# Patient Record
Sex: Female | Born: 1975 | Race: White | Hispanic: No | Marital: Married | Smoking: Never smoker
Health system: Southern US, Community
[De-identification: ages and names within clinical notes are randomized; demographics above are authoritative.]

## PROBLEM LIST (undated history)

## (undated) DIAGNOSIS — K439 Ventral hernia without obstruction or gangrene: Secondary | ICD-10-CM

## (undated) DIAGNOSIS — R0602 Shortness of breath: Secondary | ICD-10-CM

## (undated) DIAGNOSIS — M5442 Lumbago with sciatica, left side: Secondary | ICD-10-CM

## (undated) DIAGNOSIS — F3340 Major depressive disorder, recurrent, in remission, unspecified: Secondary | ICD-10-CM

## (undated) DIAGNOSIS — M533 Sacrococcygeal disorders, not elsewhere classified: Secondary | ICD-10-CM

## (undated) DIAGNOSIS — M47816 Spondylosis without myelopathy or radiculopathy, lumbar region: Secondary | ICD-10-CM

## (undated) DIAGNOSIS — R002 Palpitations: Secondary | ICD-10-CM

## (undated) DIAGNOSIS — R932 Abnormal findings on diagnostic imaging of liver and biliary tract: Secondary | ICD-10-CM

## (undated) DIAGNOSIS — I951 Orthostatic hypotension: Secondary | ICD-10-CM

## (undated) DIAGNOSIS — Z8701 Personal history of pneumonia (recurrent): Secondary | ICD-10-CM

## (undated) DIAGNOSIS — R000 Tachycardia, unspecified: Secondary | ICD-10-CM

## (undated) DIAGNOSIS — M170 Bilateral primary osteoarthritis of knee: Secondary | ICD-10-CM

## (undated) DIAGNOSIS — F329 Major depressive disorder, single episode, unspecified: Secondary | ICD-10-CM

## (undated) DIAGNOSIS — R112 Nausea with vomiting, unspecified: Secondary | ICD-10-CM

## (undated) DIAGNOSIS — G43909 Migraine, unspecified, not intractable, without status migrainosus: Secondary | ICD-10-CM

## (undated) DIAGNOSIS — Z7901 Long term (current) use of anticoagulants: Secondary | ICD-10-CM

## (undated) DIAGNOSIS — R413 Other amnesia: Secondary | ICD-10-CM

## (undated) DIAGNOSIS — IMO0002 Reserved for concepts with insufficient information to code with codable children: Secondary | ICD-10-CM

## (undated) DIAGNOSIS — L723 Sebaceous cyst: Secondary | ICD-10-CM

## (undated) DIAGNOSIS — R0781 Pleurodynia: Secondary | ICD-10-CM

## (undated) DIAGNOSIS — Z86718 Personal history of other venous thrombosis and embolism: Secondary | ICD-10-CM

## (undated) DIAGNOSIS — N6310 Unspecified lump in the right breast, unspecified quadrant: Secondary | ICD-10-CM

## (undated) DIAGNOSIS — L089 Local infection of the skin and subcutaneous tissue, unspecified: Secondary | ICD-10-CM

## (undated) DIAGNOSIS — J45909 Unspecified asthma, uncomplicated: Secondary | ICD-10-CM

## (undated) DIAGNOSIS — R599 Enlarged lymph nodes, unspecified: Secondary | ICD-10-CM

## (undated) DIAGNOSIS — Z803 Family history of malignant neoplasm of breast: Secondary | ICD-10-CM

## (undated) DIAGNOSIS — J40 Bronchitis, not specified as acute or chronic: Secondary | ICD-10-CM

## (undated) DIAGNOSIS — J189 Pneumonia, unspecified organism: Secondary | ICD-10-CM

## (undated) DIAGNOSIS — M5136 Other intervertebral disc degeneration, lumbar region: Secondary | ICD-10-CM

## (undated) DIAGNOSIS — F32A Depression, unspecified: Secondary | ICD-10-CM

## (undated) DIAGNOSIS — Z9981 Dependence on supplemental oxygen: Secondary | ICD-10-CM

## (undated) DIAGNOSIS — K769 Liver disease, unspecified: Secondary | ICD-10-CM

## (undated) DIAGNOSIS — G8929 Other chronic pain: Secondary | ICD-10-CM

## (undated) DIAGNOSIS — Z9889 Other specified postprocedural states: Secondary | ICD-10-CM

## (undated) DIAGNOSIS — D649 Anemia, unspecified: Secondary | ICD-10-CM

## (undated) DIAGNOSIS — Z86711 Personal history of pulmonary embolism: Secondary | ICD-10-CM

## (undated) DIAGNOSIS — C801 Malignant (primary) neoplasm, unspecified: Secondary | ICD-10-CM

## (undated) DIAGNOSIS — G43119 Migraine with aura, intractable, without status migrainosus: Secondary | ICD-10-CM

## (undated) DIAGNOSIS — E538 Deficiency of other specified B group vitamins: Secondary | ICD-10-CM

## (undated) DIAGNOSIS — R51 Headache: Secondary | ICD-10-CM

## (undated) DIAGNOSIS — D249 Benign neoplasm of unspecified breast: Secondary | ICD-10-CM

## (undated) DIAGNOSIS — K651 Peritoneal abscess: Secondary | ICD-10-CM

## (undated) DIAGNOSIS — R222 Localized swelling, mass and lump, trunk: Secondary | ICD-10-CM

## (undated) HISTORY — DX: Orthostatic hypotension: I95.1

## (undated) HISTORY — DX: Ventral hernia without obstruction or gangrene: K43.9

## (undated) HISTORY — DX: Migraine with aura, intractable, without status migrainosus: G43.119

## (undated) HISTORY — DX: Peritoneal abscess: K65.1

## (undated) HISTORY — DX: Bronchitis, not specified as acute or chronic: J40

## (undated) HISTORY — DX: Sacrococcygeal disorders, not elsewhere classified: M53.3

## (undated) HISTORY — DX: Deficiency of other specified B group vitamins: E53.8

## (undated) HISTORY — DX: Family history of malignant neoplasm of breast: Z80.3

## (undated) HISTORY — DX: Pneumonia, unspecified organism: J18.9

## (undated) HISTORY — DX: Personal history of pneumonia (recurrent): Z87.01

## (undated) HISTORY — PX: HERNIA REPAIR: SHX51

## (undated) HISTORY — DX: Anemia, unspecified: D64.9

## (undated) HISTORY — DX: Unspecified lump in the right breast, unspecified quadrant: N63.10

## (undated) HISTORY — DX: Sebaceous cyst: L72.3

## (undated) HISTORY — DX: Benign neoplasm of unspecified breast: D24.9

## (undated) HISTORY — DX: Personal history of pulmonary embolism: Z86.711

## (undated) HISTORY — DX: Tachycardia, unspecified: R00.0

## (undated) HISTORY — DX: Other chronic pain: G89.29

## (undated) HISTORY — DX: Shortness of breath: R06.02

## (undated) HISTORY — DX: Lumbago with sciatica, left side: M54.42

## (undated) HISTORY — DX: Personal history of other venous thrombosis and embolism: Z86.718

## (undated) HISTORY — DX: Abnormal findings on diagnostic imaging of liver and biliary tract: R93.2

## (undated) HISTORY — DX: Long term (current) use of anticoagulants: Z79.01

## (undated) HISTORY — DX: Other intervertebral disc degeneration, lumbar region: M51.36

## (undated) HISTORY — DX: Unspecified asthma, uncomplicated: J45.909

## (undated) HISTORY — DX: Pleurodynia: R07.81

## (undated) HISTORY — DX: Dependence on supplemental oxygen: Z99.81

## (undated) HISTORY — DX: Other amnesia: R41.3

## (undated) HISTORY — DX: Major depressive disorder, recurrent, in remission, unspecified: F33.40

## (undated) HISTORY — DX: Bilateral primary osteoarthritis of knee: M17.0

## (undated) HISTORY — DX: Enlarged lymph nodes, unspecified: R59.9

## (undated) HISTORY — DX: Liver disease, unspecified: K76.9

## (undated) HISTORY — DX: Spondylosis without myelopathy or radiculopathy, lumbar region: M47.816

## (undated) HISTORY — DX: Migraine, unspecified, not intractable, without status migrainosus: G43.909

## (undated) HISTORY — DX: Localized swelling, mass and lump, trunk: R22.2

## (undated) HISTORY — DX: Reserved for concepts with insufficient information to code with codable children: IMO0002

## (undated) HISTORY — PX: CARDIAC CATHETERIZATION: SHX172

## (undated) HISTORY — DX: Local infection of the skin and subcutaneous tissue, unspecified: L08.9

## (undated) HISTORY — DX: Palpitations: R00.2

---

## 2001-10-14 HISTORY — PX: KNEE SURGERY: SHX244

## 2007-02-20 ENCOUNTER — Encounter: Admission: RE | Admit: 2007-02-20 | Discharge: 2007-02-20 | Payer: Self-pay | Admitting: Internal Medicine

## 2009-12-25 DIAGNOSIS — F329 Major depressive disorder, single episode, unspecified: Secondary | ICD-10-CM

## 2009-12-25 DIAGNOSIS — J45909 Unspecified asthma, uncomplicated: Secondary | ICD-10-CM

## 2009-12-25 DIAGNOSIS — F32A Depression, unspecified: Secondary | ICD-10-CM | POA: Insufficient documentation

## 2009-12-25 DIAGNOSIS — G43119 Migraine with aura, intractable, without status migrainosus: Secondary | ICD-10-CM

## 2009-12-25 HISTORY — DX: Unspecified asthma, uncomplicated: J45.909

## 2009-12-25 HISTORY — DX: Migraine with aura, intractable, without status migrainosus: G43.119

## 2011-04-02 ENCOUNTER — Encounter: Payer: Self-pay | Admitting: *Deleted

## 2011-04-02 ENCOUNTER — Telehealth: Payer: Self-pay | Admitting: *Deleted

## 2011-04-02 NOTE — Telephone Encounter (Signed)
Tara Clarke at Dr Chodri's office is aware and will check on pre-cert, gave order for bmet, cbc and pt/inr

## 2011-04-02 NOTE — Telephone Encounter (Signed)
Per Dr Terrilee Croak office pt needs RHC for dyspnea r/o pulm htn, cath sch for 6/27 at 1:30 pt is aware and instructions reviewed w/her via phone and copy mailed, she will go to Dr Terrilee Croak office on Fri or Mon for labs.

## 2011-04-10 ENCOUNTER — Inpatient Hospital Stay (HOSPITAL_BASED_OUTPATIENT_CLINIC_OR_DEPARTMENT_OTHER)
Admission: RE | Admit: 2011-04-10 | Discharge: 2011-04-10 | Disposition: A | Discharge status: Home / Self Care | Payer: BC Managed Care – PPO | Source: Ambulatory Visit | Attending: Internal Medicine | Admitting: Internal Medicine

## 2011-04-10 ENCOUNTER — Ambulatory Visit (HOSPITAL_COMMUNITY): Admit: 2011-04-10 | Payer: Self-pay | Admitting: Internal Medicine

## 2011-04-10 DIAGNOSIS — R0902 Hypoxemia: Secondary | ICD-10-CM | POA: Insufficient documentation

## 2011-04-10 DIAGNOSIS — R0609 Other forms of dyspnea: Secondary | ICD-10-CM

## 2011-04-10 DIAGNOSIS — R0989 Other specified symptoms and signs involving the circulatory and respiratory systems: Secondary | ICD-10-CM

## 2011-04-10 LAB — POCT I-STAT 3, VENOUS BLOOD GAS (G3P V)
Acid-base deficit: 1 mmol/L (ref 0.0–2.0)
Acid-base deficit: 2 mmol/L (ref 0.0–2.0)
Bicarbonate: 21.9 mEq/L (ref 20.0–24.0)
Bicarbonate: 23.4 mEq/L (ref 20.0–24.0)
Bicarbonate: 24 mEq/L (ref 20.0–24.0)
O2 Saturation: 79 %
TCO2: 23 mmol/L (ref 0–100)
TCO2: 25 mmol/L (ref 0–100)
pCO2, Ven: 37 mmHg — ABNORMAL LOW (ref 45.0–50.0)
pH, Ven: 7.381 — ABNORMAL HIGH (ref 7.250–7.300)
pH, Ven: 7.401 — ABNORMAL HIGH (ref 7.250–7.300)
pO2, Ven: 41 mmHg (ref 30.0–45.0)

## 2011-04-10 LAB — POCT I-STAT 3, ART BLOOD GAS (G3+)
pCO2 arterial: 38.6 mmHg (ref 35.0–45.0)
pH, Arterial: 7.265 — ABNORMAL LOW (ref 7.350–7.400)

## 2011-04-18 NOTE — Cardiovascular Report (Signed)
  NAMECHELLY, DOMBECK              ACCOUNT NO.:  [id number]  MEDICAL RECORD NO.:  [id number]  LOCATION:                                 FACILITY:  PHYSICIAN:  Bevelyn Buckles. Alyssha Housh, MDDATE OF BIRTH:  [birth year]  DATE OF PROCEDURE:  04/10/2011 DATE OF DISCHARGE:                           CARDIAC CATHETERIZATION   REFERRING PHYSICIAN:  Tanvir A. Blenda Nicely, MD, at [facility].  IDENTIFICATION:  Ms. Mcgilvray is a very pleasant 35 year old woman with a history of obesity and asthma.  She has been having ongoing unexplained dyspnea.  She had a CT scan of the chest and left heart catheterization which showed normal coronaries and no evidence of pulmonary embolus. However, she has had continued dyspnea as well as hypoxemia and she is referred for right heart catheterization to exclude pulmonary hypertension.  PROCEDURES PERFORMED: 1. Right heart cath. 2. Shunt run.  DESCRIPTION OF PROCEDURE:  The risks and indication were explained. Consent was signed and placed on the chart.  A 7-French venous sheath was placed in the right femoral vein using modified Seldinger technique. Standard Swan-Ganz catheter was used.  There are no apparent complications.  HEMODYNAMIC RESULTS:  Right atrial pressure mean of 2, RV pressure 29/2 and EDP of 5.  PA pressure 24/6 with a mean of 15.  Pulmonary capillary wedge pressure mean of 7.  Fick cardiac output was 7.0.  Cardiac index was 3.6.  Pulmonary vascular resistance was 1.1 Woods units.  There is no significant increase in pulmonary artery pressure with saline arm exercises.  Saturations on room air; femoral artery saturation 97%, pulmonary artery saturation 74 and 79%, RV saturation 79%, SVC saturation 75%.  ASSESSMENT: 1. Normal right heart catheterization with normal intracardiac and     pulmonary pressures. 2. No evidence of intracardiac shunt. 3. No evidence of exercise-induced pulmonary hypertension.  PLAN/DISCUSSION:  Ms.  Bohan right heart catheter is completely normal.  There is no evidence of pulmonary hypertension.  I suspect her dyspnea and hypoxemia is due in part to her body habitus and possibly her asthma or other underlying pulmonary condition.  She will follow up with Dr. Blenda Nicely.     Bevelyn Buckles. Roniyah Llorens, MD     DRB/MEDQ  D:  04/10/2011  T:  04/11/2011  Job:  [id number]  cc:   Eual Fines A. Chodri, M.D.  Electronically Signed by Arvilla Meres MD on 04/18/2011 03:29:35 PM

## 2011-05-15 ENCOUNTER — Encounter: Payer: Self-pay | Admitting: Internal Medicine

## 2011-08-16 ENCOUNTER — Ambulatory Visit (INDEPENDENT_AMBULATORY_CARE_PROVIDER_SITE_OTHER): Payer: BC Managed Care – PPO | Admitting: General Surgery

## 2011-08-16 ENCOUNTER — Encounter (INDEPENDENT_AMBULATORY_CARE_PROVIDER_SITE_OTHER): Payer: Self-pay | Admitting: General Surgery

## 2011-08-16 VITALS — BP 118/78 | HR 86 | Temp 98.0°F | Ht 61.5 in | Wt 228.4 lb

## 2011-08-16 DIAGNOSIS — N6310 Unspecified lump in the right breast, unspecified quadrant: Secondary | ICD-10-CM

## 2011-08-16 DIAGNOSIS — N63 Unspecified lump in unspecified breast: Secondary | ICD-10-CM

## 2011-08-16 DIAGNOSIS — D249 Benign neoplasm of unspecified breast: Secondary | ICD-10-CM

## 2011-08-16 HISTORY — DX: Unspecified lump in the right breast, unspecified quadrant: N63.10

## 2011-08-16 HISTORY — DX: Benign neoplasm of unspecified breast: D24.9

## 2011-08-16 NOTE — Progress Notes (Signed)
Subjective:     Patient ID: Tara Clarke, female   DOB: 13-Aug-1976, 35 y.o.   MRN: [id number]  HPI Patient is a 35 year old female who presents with a year history of a left breast mass. This has been ultrasounded and mammogramed and biopsied.  All are consistent with fibroadenoma. The patient however felt that this mass is continuing to enlarge.  It also becomes very painful with her menstrual cycle. She is also had a new spot that she noticed 3 days ago on her right breast. She has a history of milky nipple discharge occasionally as well. She was told that this is hormonal but she is not lactating.  Past Medical History  Diagnosis Date  . Asthma   . Lymph node enlargement     left   Past Surgical History  Procedure Date  . Knee surgery\ 12/2002   Family History  Problem Relation Age of Onset  . Cancer Maternal Aunt     breast, ovarian  . Cancer Maternal Grandmother     breast, lung, brain  . Cancer Cousin     ovarian   History   Social History  . Marital Status: Unknown    Spouse Name: N/A    Number of Children: N/A  . Years of Education: N/A   Social History Main Topics  . Smoking status: Never Smoker   . Smokeless tobacco: None  . Alcohol Use: No  . Drug Use: No  . Sexually Active:    Other Topics Concern  . None   Social History Narrative  . None    Review of Systems History of R axillary lymphadenopathy.    Objective:   Physical Exam  Constitutional: She is oriented to person, place, and time. She appears well-developed and well-nourished. No distress.  HENT:  Head: Normocephalic and atraumatic.       Poor dentition upper and lower teeth.  Eyes: Conjunctivae are normal. Pupils are equal, round, and reactive to light. No scleral icterus.  Neck: Normal range of motion. Neck supple. No tracheal deviation present. No thyromegaly present.  Cardiovascular: Normal rate, regular rhythm, normal heart sounds and intact distal pulses.  Exam reveals no gallop and  no friction rub.   No murmur heard. Pulmonary/Chest: Effort normal and breath sounds normal. No respiratory distress. She has no wheezes. She has no rales. She exhibits no tenderness.    Abdominal: Soft. Bowel sounds are normal. She exhibits no distension and no mass. There is no tenderness. There is no rebound and no guarding.  Musculoskeletal: Normal range of motion. She exhibits no edema and no tenderness.  Lymphadenopathy:    She has no cervical adenopathy.  Neurological: She is alert and oriented to person, place, and time. Coordination normal.  Skin: Skin is warm and dry. No rash noted. She is not diaphoretic. No erythema. No pallor.  Psychiatric: She has a normal mood and affect. Her behavior is normal. Judgment and thought content normal.       Assessment/Plan:     Fibroadenoma of breast, left side, biopsy proven. Will schedule excisional biopsy.  Pt states that there is a second mass that I cannot feel.   Will discuss with [facility] radiologist if that area needs needle loc.    The surgical procedure was described to the patient.  I discussed the incision type and location and whether we would need radiology involved on the day of surgery with a wire marker and/or sentinel node.      The risks and  benefits of the procedure were described to the patient and he/she wishes to proceed.    We discussed the risks bleeding, infection, damage to other structures, need for further procedures/surgeries.  We discussed the risk of seroma.  The patient was advised if the area in the breast in cancer, we may need to go back to surgery for additional tissue to obtain negative margins or for a lymph node biopsy. The patient was advised that these are the most common complications, but that others can occur as well.  They were advised against taking aspirin or other anti-inflammatory agents/blood thinners the week before surgery.     Breast mass, right 1 cm 11 oclock New mass needs new mammo.  Will  order dx mammo/ultrasound.   Will make final surgical plan after new films available.

## 2011-08-16 NOTE — Assessment & Plan Note (Addendum)
New mass needs new mammo.  Will order dx mammo/ultrasound.   Will make final surgical plan after new films available.

## 2011-08-16 NOTE — Assessment & Plan Note (Signed)
Will schedule excisional biopsy.  Pt states that there is a second mass that I cannot feel.   Will discuss with [facility] radiologist if that area needs needle loc.    The surgical procedure was described to the patient.  I discussed the incision type and location and whether we would need radiology involved on the day of surgery with a wire marker and/or sentinel node.      The risks and benefits of the procedure were described to the patient and he/she wishes to proceed.    We discussed the risks bleeding, infection, damage to other structures, need for further procedures/surgeries.  We discussed the risk of seroma.  The patient was advised if the area in the breast in cancer, we may need to go back to surgery for additional tissue to obtain negative margins or for a lymph node biopsy. The patient was advised that these are the most common complications, but that others can occur as well.  They were advised against taking aspirin or other anti-inflammatory agents/blood thinners the week before surgery.

## 2011-08-16 NOTE — Patient Instructions (Signed)
Get right sided films. Will make final surgical plan after films available.

## 2011-08-26 ENCOUNTER — Telehealth (INDEPENDENT_AMBULATORY_CARE_PROVIDER_SITE_OTHER): Payer: Self-pay | Admitting: General Surgery

## 2011-08-26 ENCOUNTER — Other Ambulatory Visit (INDEPENDENT_AMBULATORY_CARE_PROVIDER_SITE_OTHER): Payer: Self-pay | Admitting: General Surgery

## 2011-08-27 ENCOUNTER — Telehealth (INDEPENDENT_AMBULATORY_CARE_PROVIDER_SITE_OTHER): Payer: Self-pay

## 2011-08-27 NOTE — Telephone Encounter (Signed)
Discussed [place] and MGM results with the pt.  Dr. Donell Beers will call her on Friday 11/16 to discuss her surgical options.

## 2011-08-30 ENCOUNTER — Other Ambulatory Visit (INDEPENDENT_AMBULATORY_CARE_PROVIDER_SITE_OTHER): Payer: Self-pay | Admitting: General Surgery

## 2011-08-30 ENCOUNTER — Telehealth (INDEPENDENT_AMBULATORY_CARE_PROVIDER_SITE_OTHER): Payer: Self-pay | Admitting: General Surgery

## 2011-08-30 NOTE — Telephone Encounter (Signed)
I spoke with Tara Clarke today.  She is waiting for Dr. Donell Beers to call her about scheduling her surgery.  She would like to hear something from [place] on Monday, 08/30/11.  Her phone number is [phone].

## 2011-09-03 ENCOUNTER — Telehealth (INDEPENDENT_AMBULATORY_CARE_PROVIDER_SITE_OTHER): Payer: Self-pay

## 2011-09-03 NOTE — Telephone Encounter (Signed)
Spoke with the pt who reports that she has discovered some new lumps in her breasts.  She wants to talk to Dr. Donell Beers about these before her surgery on 09/25/11.  I let her know that Dr. Donell Beers was out of the office until next week, and we would call her back then.  The pt agreed.

## 2011-09-10 ENCOUNTER — Telehealth (INDEPENDENT_AMBULATORY_CARE_PROVIDER_SITE_OTHER): Payer: Self-pay | Admitting: General Surgery

## 2011-09-11 ENCOUNTER — Telehealth (INDEPENDENT_AMBULATORY_CARE_PROVIDER_SITE_OTHER): Payer: Self-pay | Admitting: General Surgery

## 2011-09-11 NOTE — Telephone Encounter (Signed)
Discussed ultrasounds with Dr. Yolanda Bonine. Do not need to needle loc any lesions. One palpable lesion per breast is what we will remove.  Reviewed with pt.

## 2011-09-18 ENCOUNTER — Encounter (INDEPENDENT_AMBULATORY_CARE_PROVIDER_SITE_OTHER): Payer: Self-pay | Admitting: General Surgery

## 2011-09-18 ENCOUNTER — Encounter (HOSPITAL_COMMUNITY): Payer: Self-pay | Admitting: Pharmacy Technician

## 2011-09-24 ENCOUNTER — Ambulatory Visit (HOSPITAL_COMMUNITY)
Admission: RE | Admit: 2011-09-24 | Discharge: 2011-09-24 | Payer: BC Managed Care – PPO | Source: Ambulatory Visit | Attending: General Surgery | Admitting: General Surgery

## 2011-09-24 ENCOUNTER — Encounter (HOSPITAL_COMMUNITY): Payer: Self-pay

## 2011-09-24 ENCOUNTER — Other Ambulatory Visit: Payer: Self-pay

## 2011-09-24 HISTORY — DX: Nausea with vomiting, unspecified: R11.2

## 2011-09-24 HISTORY — DX: Other specified postprocedural states: Z98.890

## 2011-09-24 LAB — BASIC METABOLIC PANEL
Calcium: 9.6 mg/dL (ref 8.4–10.5)
Chloride: 104 mEq/L (ref 96–112)
Creatinine, Ser: 0.63 mg/dL (ref 0.50–1.10)
GFR calc Af Amer: >90 mL/min (ref >90)
Sodium: 138 mEq/L (ref 135–145)

## 2011-09-24 LAB — CBC
MCV: 85.6 fL (ref 78.0–100.0)
Platelets: 388 10*3/uL (ref 150–400)
RBC: 4.43 MIL/uL (ref 3.87–5.11)
WBC: 9.3 10*3/uL (ref 4.0–10.5)

## 2011-09-24 LAB — HCG, SERUM, QUALITATIVE: Preg, Serum: NEGATIVE

## 2011-09-24 LAB — SURGICAL PCR SCREEN: Staphylococcus aureus: NEGATIVE

## 2011-09-24 NOTE — Patient Instructions (Signed)
[address] Kad[address] KATHELINE BRENDLINGER  09/24/2011   Your procedure is scheduled on: 09-24-2010 Report to [facility] at 1030 AM.  Call this number if you have problems the morning of surgery: [phone]   Remember:   Do not eat food:After Midnight.  May have clear liquids:until Midnight .  Clear liquids include soda, tea, black coffee, apple or grape juice, broth.  Take these medicines the morning of surgery with A SIP OF WATER: advair, albuterol inhaler if needed and bring inhaler   Do not wear jewelry, make-up or nail polish.  Do not wear lotions, powders, or perfumes.              Do not shave 48 hours prior to surgery.  Do not bring valuables to the hospital.  Contacts, dentures or bridgework may not be worn into surgery.  Leave suitcase in the car. After surgery it may be brought to your room.  For patients admitted to the hospital, checkout time is 11:00 AM the day of discharge.   Patients discharged the day of surgery will not be allowed to drive home.  Name and phone number of your driver: nick spouse [phone] cell  Special Instructions: CHG Shower Use Special Wash: 1/2 bottle night before surgery and 1/2 bottle morning of surgery.neck down avoid private area   Please read over the following fact sheets that you were given: MRSA Information  Cain Sieve, rn [facility] pre op nurse phone number [phone]

## 2011-09-24 NOTE — Pre-Procedure Instructions (Signed)
Heart cath 05-17-2010, right 04-10-2011  results  On chart Echo 06-28-11 on chart Chest ct 02-15-11 [facility] on chart pulmonary function test 03-31-11 on chart Requested last office visit note and pulmonary function test dr Britt Bottom siqueiros [facility], release of info sent, no records received

## 2011-09-25 ENCOUNTER — Encounter (HOSPITAL_COMMUNITY): Payer: Self-pay | Admitting: *Deleted

## 2011-09-25 ENCOUNTER — Encounter (HOSPITAL_COMMUNITY)
Admission: RE | Disposition: A | Discharge status: Home / Self Care | Payer: Self-pay | Source: Ambulatory Visit | Attending: General Surgery

## 2011-09-25 ENCOUNTER — Encounter (HOSPITAL_COMMUNITY): Payer: Self-pay | Admitting: Anesthesiology

## 2011-09-25 ENCOUNTER — Ambulatory Visit (HOSPITAL_COMMUNITY)
Admission: RE | Admit: 2011-09-25 | Discharge: 2011-09-25 | Disposition: A | Discharge status: Home / Self Care | Payer: BC Managed Care – PPO | Source: Ambulatory Visit | Attending: General Surgery | Admitting: General Surgery

## 2011-09-25 ENCOUNTER — Ambulatory Visit (HOSPITAL_COMMUNITY): Payer: BC Managed Care – PPO | Admitting: Anesthesiology

## 2011-09-25 ENCOUNTER — Other Ambulatory Visit (INDEPENDENT_AMBULATORY_CARE_PROVIDER_SITE_OTHER): Payer: Self-pay | Admitting: General Surgery

## 2011-09-25 DIAGNOSIS — Z01812 Encounter for preprocedural laboratory examination: Secondary | ICD-10-CM | POA: Insufficient documentation

## 2011-09-25 DIAGNOSIS — N6019 Diffuse cystic mastopathy of unspecified breast: Secondary | ICD-10-CM

## 2011-09-25 DIAGNOSIS — Z8041 Family history of malignant neoplasm of ovary: Secondary | ICD-10-CM | POA: Insufficient documentation

## 2011-09-25 DIAGNOSIS — J45909 Unspecified asthma, uncomplicated: Secondary | ICD-10-CM | POA: Insufficient documentation

## 2011-09-25 DIAGNOSIS — D249 Benign neoplasm of unspecified breast: Secondary | ICD-10-CM

## 2011-09-25 DIAGNOSIS — N6039 Fibrosclerosis of unspecified breast: Secondary | ICD-10-CM | POA: Insufficient documentation

## 2011-09-25 DIAGNOSIS — Z803 Family history of malignant neoplasm of breast: Secondary | ICD-10-CM | POA: Insufficient documentation

## 2011-09-25 DIAGNOSIS — N6009 Solitary cyst of unspecified breast: Secondary | ICD-10-CM | POA: Insufficient documentation

## 2011-09-25 DIAGNOSIS — Z801 Family history of malignant neoplasm of trachea, bronchus and lung: Secondary | ICD-10-CM | POA: Insufficient documentation

## 2011-09-25 HISTORY — PX: BREAST BIOPSY: SHX20

## 2011-09-25 SURGERY — BREAST BIOPSY
Anesthesia: General | Site: Breast | Laterality: Bilateral | Wound class: Clean

## 2011-09-25 MED ORDER — PROMETHAZINE HCL 25 MG/ML IJ SOLN: 12.5000 mg | Freq: Four times a day (QID) | INTRAMUSCULAR | Status: DC | PRN

## 2011-09-25 MED ORDER — ONDANSETRON HCL 4 MG/2ML IJ SOLN
INTRAMUSCULAR | Status: DC | PRN
  Administered 2011-09-25: 4 mg via INTRAVENOUS

## 2011-09-25 MED ORDER — SODIUM CHLORIDE 0.9 % IV SOLN: 250.0000 mL | INTRAVENOUS | Status: DC | PRN

## 2011-09-25 MED ORDER — SODIUM CHLORIDE 0.9 % IJ SOLN: 3.0000 mL | Freq: Two times a day (BID) | INTRAMUSCULAR | Status: DC

## 2011-09-25 MED ORDER — SCOPOLAMINE 1 MG/3DAYS TD PT72
MEDICATED_PATCH | TRANSDERMAL | Status: AC
  Filled 2011-09-25: qty 1

## 2011-09-25 MED ORDER — LIDOCAINE HCL (CARDIAC) 10 MG/ML IV SOLN
INTRAVENOUS | Status: DC | PRN
  Administered 2011-09-25: 100 mg via INTRAVENOUS

## 2011-09-25 MED ORDER — FENTANYL CITRATE 0.05 MG/ML IJ SOLN: 25.0000 ug | INTRAMUSCULAR | Status: DC | PRN

## 2011-09-25 MED ORDER — BUPIVACAINE-EPINEPHRINE PF 0.25-1:200000 % IJ SOLN
INTRAMUSCULAR | Status: AC
  Filled 2011-09-25: qty 30

## 2011-09-25 MED ORDER — LIDOCAINE HCL 1 % IJ SOLN
INTRAMUSCULAR | Status: AC
  Filled 2011-09-25: qty 20

## 2011-09-25 MED ORDER — SODIUM CHLORIDE 0.9 % IJ SOLN: 3.0000 mL | INTRAMUSCULAR | Status: DC | PRN

## 2011-09-25 MED ORDER — PROPOFOL 10 MG/ML IV EMUL
INTRAVENOUS | Status: DC | PRN
  Administered 2011-09-25: 180 ug/kg/min via INTRAVENOUS

## 2011-09-25 MED ORDER — LIDOCAINE HCL (PF) 1 % IJ SOLN
INTRAMUSCULAR | Status: DC | PRN
  Administered 2011-09-25: 20 mL

## 2011-09-25 MED ORDER — ACETAMINOPHEN 10 MG/ML IV SOLN
INTRAVENOUS | Status: DC | PRN
  Administered 2011-09-25: 1000 mg via INTRAVENOUS

## 2011-09-25 MED ORDER — METOCLOPRAMIDE HCL 5 MG/ML IJ SOLN
INTRAMUSCULAR | Status: DC | PRN
  Administered 2011-09-25: 10 mg via INTRAVENOUS

## 2011-09-25 MED ORDER — MORPHINE SULFATE 2 MG/ML IJ SOLN: 2.0000 mg | INTRAMUSCULAR | Status: DC | PRN

## 2011-09-25 MED ORDER — ONDANSETRON HCL 4 MG/2ML IJ SOLN: 4.0000 mg | Freq: Four times a day (QID) | INTRAMUSCULAR | Status: DC | PRN

## 2011-09-25 MED ORDER — FENTANYL CITRATE 0.05 MG/ML IJ SOLN
INTRAMUSCULAR | Status: DC | PRN
  Administered 2011-09-25 (×5): 50 ug via INTRAVENOUS

## 2011-09-25 MED ORDER — LACTATED RINGERS IV SOLN
INTRAVENOUS | Status: DC
  Administered 2011-09-25: 1000 mL via INTRAVENOUS
  Administered 2011-09-25: 13:00:00 via INTRAVENOUS

## 2011-09-25 MED ORDER — PROMETHAZINE HCL 25 MG/ML IJ SOLN: 6.2500 mg | INTRAMUSCULAR | Status: DC | PRN

## 2011-09-25 MED ORDER — ACETAMINOPHEN 650 MG RE SUPP
650.0000 mg | RECTAL | Status: DC | PRN
  Filled 2011-09-25: qty 1

## 2011-09-25 MED ORDER — HETASTARCH-ELECTROLYTES 6 % IV SOLN
INTRAVENOUS | Status: DC | PRN
  Administered 2011-09-25: 14:00:00 via INTRAVENOUS

## 2011-09-25 MED ORDER — OXYCODONE-ACETAMINOPHEN 5-325 MG PO TABS: 1.0000 | ORAL_TABLET | ORAL | Status: AC | PRN

## 2011-09-25 MED ORDER — ACETAMINOPHEN 325 MG PO TABS: 650.0000 mg | ORAL_TABLET | ORAL | Status: DC | PRN

## 2011-09-25 MED ORDER — ACETAMINOPHEN 10 MG/ML IV SOLN
INTRAVENOUS | Status: AC
  Filled 2011-09-25: qty 100

## 2011-09-25 MED ORDER — DEXAMETHASONE SODIUM PHOSPHATE 10 MG/ML IJ SOLN
INTRAMUSCULAR | Status: DC | PRN
  Administered 2011-09-25: 10 mg via INTRAVENOUS

## 2011-09-25 MED ORDER — OXYCODONE-ACETAMINOPHEN 5-325 MG PO TABS: 1.0000 | ORAL_TABLET | ORAL | Status: DC | PRN

## 2011-09-25 MED ORDER — BUPIVACAINE-EPINEPHRINE 0.25% -1:200000 IJ SOLN
INTRAMUSCULAR | Status: DC | PRN
  Administered 2011-09-25: 30 mL

## 2011-09-25 MED ORDER — MIDAZOLAM HCL 5 MG/5ML IJ SOLN
INTRAMUSCULAR | Status: DC | PRN
  Administered 2011-09-25: 1 mg via INTRAVENOUS
  Administered 2011-09-25: 2 mg via INTRAVENOUS

## 2011-09-25 MED ORDER — CEFAZOLIN SODIUM-DEXTROSE 2-3 GM-% IV SOLR
2.0000 g | Freq: Once | INTRAVENOUS | Status: AC
  Administered 2011-09-25: 2 g via INTRAVENOUS

## 2011-09-25 MED ORDER — CEFAZOLIN SODIUM-DEXTROSE 2-3 GM-% IV SOLR
INTRAVENOUS | Status: AC
  Filled 2011-09-25: qty 50

## 2011-09-25 MED ORDER — OXYCODONE HCL 5 MG PO TABS: 5.0000 mg | ORAL_TABLET | ORAL | Status: DC | PRN

## 2011-09-25 MED ORDER — SCOPOLAMINE 1 MG/3DAYS TD PT72
MEDICATED_PATCH | TRANSDERMAL | Status: DC | PRN
  Administered 2011-09-25: 1 via TRANSDERMAL

## 2011-09-25 SURGICAL SUPPLY — 48 items
ADH SKN CLS APL DERMABOND .7 (GAUZE/BANDAGES/DRESSINGS)
BANDAGE ELASTIC 6 VELCRO ST LF (GAUZE/BANDAGES/DRESSINGS) IMPLANT
BANDAGE GAUZE ELAST BULKY 4 IN (GAUZE/BANDAGES/DRESSINGS) IMPLANT
BINDER BREAST XLRG (GAUZE/BANDAGES/DRESSINGS) ×1 IMPLANT
BLADE SURG 15 STRL LF DISP TIS (BLADE) IMPLANT
BLADE SURG 15 STRL SS (BLADE)
BLADE SURG SZ10 CARB STEEL (BLADE) ×2 IMPLANT
BNDG COHESIVE 4X5 TAN STRL (GAUZE/BANDAGES/DRESSINGS) IMPLANT
CANISTER SUCTION 2500CC (MISCELLANEOUS) ×2 IMPLANT
CLIP TI WIDE RED SMALL 6 (CLIP) ×2 IMPLANT
CLOSURE STERI STRIP 1/2 X4 (GAUZE/BANDAGES/DRESSINGS) ×1 IMPLANT
CLOTH BEACON ORANGE TIMEOUT ST (SAFETY) ×2 IMPLANT
CONT SPEC 4OZ CLIKSEAL STRL BL (MISCELLANEOUS) ×2 IMPLANT
DECANTER SPIKE VIAL GLASS SM (MISCELLANEOUS) ×2 IMPLANT
DERMABOND ADVANCED (GAUZE/BANDAGES/DRESSINGS)
DERMABOND ADVANCED .7 DNX12 (GAUZE/BANDAGES/DRESSINGS) IMPLANT
DRAIN CHANNEL RND F F (WOUND CARE) IMPLANT
DRSG PAD ABDOMINAL 8X10 ST (GAUZE/BANDAGES/DRESSINGS) ×1 IMPLANT
ELECT CAUTERY BLADE 6.4 (BLADE) ×2 IMPLANT
ELECT REM PT RETURN 9FT ADLT (ELECTROSURGICAL) ×2
ELECTRODE REM PT RTRN 9FT ADLT (ELECTROSURGICAL) ×1 IMPLANT
EVACUATOR SILICONE 100CC (DRAIN) IMPLANT
GAUZE SPONGE 4X4 16PLY XRAY LF (GAUZE/BANDAGES/DRESSINGS) IMPLANT
GLOVE BIO SURGEON STRL SZ 6 (GLOVE) ×4 IMPLANT
GLOVE BIOGEL PI IND STRL 6.5 (GLOVE) ×1 IMPLANT
GLOVE BIOGEL PI IND STRL 7.0 (GLOVE) ×1 IMPLANT
GLOVE BIOGEL PI INDICATOR 6.5 (GLOVE) ×1
GLOVE BIOGEL PI INDICATOR 7.0 (GLOVE) ×1
GLOVE INDICATOR 6.5 STRL GRN (GLOVE) ×4 IMPLANT
GOWN PREVENTION PLUS XLARGE (GOWN DISPOSABLE) IMPLANT
GOWN PREVENTION PLUS XXLARGE (GOWN DISPOSABLE) ×2 IMPLANT
KIT BASIN OR (CUSTOM PROCEDURE TRAY) ×2 IMPLANT
NDL SPNL 22GX3.5 QUINCKE BK (NEEDLE) ×1 IMPLANT
NEEDLE HYPO 22GX1.5 SAFETY (NEEDLE) IMPLANT
NEEDLE SPNL 22GX3.5 QUINCKE BK (NEEDLE) ×2 IMPLANT
NS IRRIG 1000ML POUR BTL (IV SOLUTION) ×2 IMPLANT
PACK GENERAL/GYN (CUSTOM PROCEDURE TRAY) ×2 IMPLANT
PACK UNIVERSAL I (CUSTOM PROCEDURE TRAY) ×2 IMPLANT
PEN SKIN MARKING BROAD (MISCELLANEOUS) ×1 IMPLANT
SPONGE GAUZE 4X4 12PLY (GAUZE/BANDAGES/DRESSINGS) ×2 IMPLANT
SPONGE LAP 18X18 X RAY DECT (DISPOSABLE) ×2 IMPLANT
STAPLER VISISTAT 35W (STAPLE) ×2 IMPLANT
STOCKINETTE 8 INCH (MISCELLANEOUS) IMPLANT
SUT VIC AB 3-0 SH 27 (SUTURE) ×4
SUT VIC AB 3-0 SH 27XBRD (SUTURE) IMPLANT
SUT VIC AB 3-0 SH 8-18 (SUTURE) ×2 IMPLANT
SYR CONTROL 10ML LL (SYRINGE) ×2 IMPLANT
TOWEL OR 17X26 10 PK STRL BLUE (TOWEL DISPOSABLE) ×2 IMPLANT

## 2011-09-25 NOTE — Anesthesia Preprocedure Evaluation (Signed)
Anesthesia Evaluation  Patient identified by MRN, date of birth, ID band Patient awake    Reviewed: Allergy & Precautions, H&P , NPO status , Patient's Chart, lab work & pertinent test results  History of Anesthesia Complications (+) PONV and Family history of anesthesia reaction  Airway Mallampati: II TM Distance: >3 FB Neck ROM: Full    Dental No notable dental hx.    Pulmonary neg pulmonary ROS, asthma ,  clear to auscultation  Pulmonary exam normal       Cardiovascular neg cardio ROS + dysrhythmias Regular Normal    Neuro/Psych Negative Neurological ROS  Negative Psych ROS   GI/Hepatic negative GI ROS, Neg liver ROS,   Endo/Other  Negative Endocrine ROS  Renal/GU negative Renal ROS  Genitourinary negative   Musculoskeletal negative musculoskeletal ROS (+)   Abdominal (+) obese,   Peds negative pediatric ROS (+)  Hematology negative hematology ROS (+)   Anesthesia Other Findings   Reproductive/Obstetrics negative OB ROS                           Anesthesia Physical Anesthesia Plan  ASA: II  Anesthesia Plan: General   Post-op Pain Management:    Induction: Intravenous  Airway Management Planned: LMA  Additional Equipment:   Intra-op Plan:   Post-operative Plan: Extubation in OR  Informed Consent: I have reviewed the patients History and Physical, chart, labs and discussed the procedure including the risks, benefits and alternatives for the proposed anesthesia with the patient or authorized representative who has indicated his/her understanding and acceptance.   Dental advisory given  Plan Discussed with: CRNA  Anesthesia Plan Comments:         Anesthesia Quick Evaluation

## 2011-09-25 NOTE — Transfer of Care (Signed)
Immediate Anesthesia Transfer of Care Note  Patient: Tara Clarke  Procedure(s) Performed:  BREAST BIOPSY - Excision Bilateral Breast Masses  Patient Location: PACU  Anesthesia Type: General  Level of Consciousness: awake, alert , oriented and responds to stimulation  Airway & Oxygen Therapy: Patient Spontanous Breathing and Patient connected to face mask oxygen  Post-op Assessment: Report given to PACU RN and Post -op Vital signs reviewed and stable  Post vital signs: stable  Complications: No apparent anesthesia complications

## 2011-09-25 NOTE — Anesthesia Postprocedure Evaluation (Signed)
  Anesthesia Post-op Note  Patient: Tara Clarke  Procedure(s) Performed:  BREAST BIOPSY - Excision Bilateral Breast Masses  Patient Location: PACU  Anesthesia Type: General  Level of Consciousness: awake and alert   Airway and Oxygen Therapy: Patient Spontanous Breathing  Post-op Pain: mild  Post-op Assessment: Post-op Vital signs reviewed, Patient's Cardiovascular Status Stable, Respiratory Function Stable, Patent Airway and No signs of Nausea or vomiting  Post-op Vital Signs: stable  Complications: No apparent anesthesia complications

## 2011-09-25 NOTE — H&P (Signed)
CC:  Bilateral breast masses.    HPI  Patient is a 35 year old female who presents with a year history of a left breast mass. This has been ultrasounded and mammogramed and biopsied. All aspects of the mass are consistent with fibroadenoma. The patient however felt that this mass is continuing to enlarge. It also becomes very painful with her menstrual cycle.  She felt a new area on her left breast as well.   She is also had a new spot that she noticed 3 days ago on her right breast. She has a history of milky nipple discharge occasionally as well. She was told that this is hormonal but she is not lactating. She underwent R ultrasound that demonstrated that the area in question on the right was also consistent with fibroadenoma.     Past Medical History   Diagnosis  Date   .  Asthma    .  Lymph node enlargement      left    Past Surgical History   Procedure  Date   .  Knee surgery\ 12/2002    Family History   Problem  Relation  Age of Onset   .  Cancer  Maternal Aunt       breast, ovarian    .  Cancer  Maternal Grandmother       breast, lung, brain    .  Cancer  Cousin       ovarian    History    Social History   .  Marital Status:  Unknown     Spouse Name:  N/A     Number of Children:  N/A   .  Years of Education:  N/A    Social History Main Topics   .  Smoking status:  Never Smoker   .  Smokeless tobacco:  None   .  Alcohol Use:  No   .  Drug Use:  No   .  Sexually Active:     Other Topics  Concern   .  None    Social History Narrative   .  None   Review of Systems  History of R axillary lymphadenopathy.   Objective:   Physical Exam  Constitutional: She is oriented to person, place, and time. She appears well-developed and well-nourished. No distress. Head: Normocephalic and atraumatic. Poor dentition upper and lower teeth.  Eyes: Conjunctivae are normal. Pupils are equal, round, and reactive to light. No scleral icterus.  Neck: Normal range of motion. Neck  supple. No tracheal deviation present. No thyromegaly present.  Cardiovascular: Normal rate, regular rhythm, normal heart sounds and intact distal pulses. Exam reveals no gallop and no friction rub.  No murmur heard.  Pulmonary/Chest: Effort normal and breath sounds normal. No respiratory distress. She has no wheezes. She has no rales. She exhibits no tenderness.  R breast demonstrates a 1 cm mass at 10 oclock.  L breast demonstrates a mass at 10 oclock, 1.5-2 cm around 5 cm from areolar border, and 2 small masses at 12 oclock at the border of the areola.   Abdominal: Soft. Bowel sounds are normal. She exhibits no distension and no mass. There is no tenderness. There is no rebound and no guarding.  Musculoskeletal: Normal range of motion. She exhibits no edema and no tenderness.  Lymphadenopathy:  She has no cervical adenopathy.  Neurological: She is alert and oriented to person, place, and time. Coordination normal.  Skin: Skin is warm and dry. No rash noted. She is  not diaphoretic. No erythema. No pallor.  Psychiatric: She has a normal mood and affect. Her behavior is normal. Judgment and thought content normal.   Assessment/Plan:   Fibroadenoma of breast, left side, biopsy proven.  Will schedule excisional biopsy. Pt states that there is a second mass that I cannot feel. I feel a second area with 2 small masses at 12 oclock.  We will also excise this area.   The surgical procedure was described to the patient. I discussed the incision type and location and whether we would need radiology involved on the day of surgery with a wire marker and/or sentinel node.  The risks and benefits of the procedure were described to the patient and he/she wishes to proceed.  We discussed the risks bleeding, infection, damage to other structures, need for further procedures/surgeries. We discussed the risk of seroma. The patient was advised if the area in the breast in cancer, we may need to go back to surgery  for additional tissue to obtain negative margins or for a lymph node biopsy. The patient was advised that these are the most common complications, but that others can occur as well. They were advised against taking aspirin or other anti-inflammatory agents/blood thinners the week before surgery.   Breast mass, right 1 cm 11 oclock  Consistent with fibroadenoma, will excise.

## 2011-09-25 NOTE — Op Note (Signed)
Breast Nodule Excision Procedure Note  Indications: This patient has a palpable nodule of the bilateral breast, and is here for diagnostic excision.  Pre-operative Diagnosis: bilateral breast nodule:  R 10 o'clock, L 10 o'clock and 12 o'clock  Post-operative Diagnosis: Same  Surgeon: Almond Lint   Anesthesia: General endotracheal anesthesia and Local anesthesia 1% buffered lidocaine, 0.25.% bupivacaine, with epinephrine  ASA Class: 2  Procedure Details  The patient was seen in the Holding Room. The risks, benefits, complications, treatment options, and expected outcomes were discussed with the patient. The possibilities of reaction to medication, pulmonary aspiration, bleeding, infection, the need for additional procedures, failure to diagnose a condition, and creating a complication requiring transfusion or operation were discussed with the patient. The patient concurred with the proposed plan, giving informed consent.  The site of surgery properly noted/marked. The patient was taken to Operating Room # 2, identified as Tara Clarke and the procedure verified as Breast Nodule Excision. A Time Out was held and the above information confirmed.  The patient was placed supine. Both breasts were prepped and draped in the standard fashion. Lidocaine 0.5% with epinephrine and bicarbonate was used to anesthetize the skin over the upper outer quadrant of the right breast.    An oblique incision was created over a palpable nodule.  Dissection was carried down through the subcutaneous fat to identify a 4 cm nodule, which was excised and submitted to pathology. The nodule was not well circumscribed due to the significant fibrocystic breast disease.  The nodule in question that was palpable pre operatively was removed.  Hemostasis was achieved with cautery. One 3-0 vicryl suture had to be placed for hemostasis.   Closure was performed in 2 layers with 3-0 Vicryl deep dermal interrupted suture and 4-0  monocryl subcuticular closure.    The left breast was then addressed in a similar fashion.  An obliquely oriented transverse incision was made over the 10 o'clock mass.  This was excised with the cautery.  A large vein was encountered that required suture ligature.  Again, this was not well circumscribed, but the questionable mass was removed.  The second area was reachable through the same incision, so this was elevated with an Allis clamp and taken with cautery through the same incision.  Hemostasis was achieved with cautery. One 3-0 vicryl suture had to be placed for hemostasis.   Closure was performed in 2 layers with 3-0 Vicryl deep dermal interrupted suture and 4-0 monocryl subcuticular closure.  Steri-Strips were applied. At the end of the operation all sponge, instrument and needle counts were correct.  Findings: Very dense breasts bilaterally.  Palpable nodules appeared to be part of complex fibrocystic breast disease.    Estimated Blood Loss:  Minimal         Total IV Fluids: 1400 ml, 400 Hextend         Specimens: R breast 10 o'clock mass and L breast 10 o'clock mass and 12 o'clock mass  Complications:  None; patient tolerated the procedure well.         Disposition: PACU - hemodynamically stable.         Condition: stable

## 2011-09-26 ENCOUNTER — Encounter (HOSPITAL_COMMUNITY): Payer: Self-pay | Admitting: General Surgery

## 2011-09-27 NOTE — Progress Notes (Signed)
Quick Note:  Please call pt and let know path is benign. ______

## 2011-09-30 ENCOUNTER — Telehealth (INDEPENDENT_AMBULATORY_CARE_PROVIDER_SITE_OTHER): Payer: Self-pay

## 2011-09-30 NOTE — Telephone Encounter (Signed)
Called pt with benign pathology results.  She has follow up appt with Dr. Donell Beers on 10/20/10.

## 2011-10-01 ENCOUNTER — Ambulatory Visit (INDEPENDENT_AMBULATORY_CARE_PROVIDER_SITE_OTHER): Payer: BC Managed Care – PPO | Admitting: General Surgery

## 2011-10-01 ENCOUNTER — Encounter (INDEPENDENT_AMBULATORY_CARE_PROVIDER_SITE_OTHER): Payer: Self-pay | Admitting: General Surgery

## 2011-10-01 VITALS — BP 118/76 | HR 72 | Temp 97.9°F | Resp 18 | Ht 61.5 in | Wt 227.0 lb

## 2011-10-01 DIAGNOSIS — D249 Benign neoplasm of unspecified breast: Secondary | ICD-10-CM

## 2011-10-01 MED ORDER — CEPHALEXIN 500 MG PO CAPS: 500.0000 mg | ORAL_CAPSULE | Freq: Three times a day (TID) | ORAL | Status: AC

## 2011-10-01 NOTE — Assessment & Plan Note (Signed)
Small seroma.   Soreness at the upper incision at area on top of hematoma  This was area right on top of pectoralis fascia.   Recommend ibuprofen 400-600 up to TID with food.  Stop if nausea/vomiting occur.   Follow up in 2 weeks. Edges of skin incision slightly pink.  Will give 1 week antibiotic.

## 2011-10-01 NOTE — Progress Notes (Signed)
HISTORY: Pt is s/p bilateral breast excisional biopsies. She felt a "sloshy" in both breasts.  She also has difficulty raising left arm.  She denies fevers/ chills.    EXAM: Bilateral breasts with seromas, not tense.  L breast with mild pinkness at edges of incision.    Pathology reviewed and demonstrates: Fibroadenoma and fibrocystic changes.     ASSESSMENT AND PLAN:   Fibroadenoma of breast, left side, biopsy proven. Small seroma.   Soreness at the upper incision at area on top of hematoma  This was area right on top of pectoralis fascia.   Recommend ibuprofen 400-600 up to TID with food.  Stop if nausea/vomiting occur.   Follow up in 2 weeks. Edges of skin incision slightly pink.  Will give 1 week antibiotic.      Maudry Diego, MD Surgical Oncology, General & Endocrine Surgery [facility], Deforest Hoyles, MD Marlow Baars, MD

## 2011-10-01 NOTE — Patient Instructions (Signed)
Call for recurrent fluid build up.

## 2011-10-11 ENCOUNTER — Encounter (INDEPENDENT_AMBULATORY_CARE_PROVIDER_SITE_OTHER): Payer: Self-pay | Admitting: Surgery

## 2011-10-11 ENCOUNTER — Ambulatory Visit (INDEPENDENT_AMBULATORY_CARE_PROVIDER_SITE_OTHER): Payer: BC Managed Care – PPO | Admitting: Surgery

## 2011-10-11 VITALS — BP 130/90 | HR 72 | Temp 98.1°F | Resp 18 | Ht 61.5 in | Wt 231.8 lb

## 2011-10-11 DIAGNOSIS — Z09 Encounter for follow-up examination after completed treatment for conditions other than malignant neoplasm: Secondary | ICD-10-CM

## 2011-10-11 NOTE — Progress Notes (Signed)
Subjective:     Patient ID: Tara Clarke, female   DOB: May 09, 1976, 35 y.o.   MRN: [id number]  HPI  This is a patient of Dr. Donell Beers. She had bilateral breast biopsies. She has been on antibiotics because of infection of the left breast. She still is having pain and is off her antibiotics Review of Systems     Objective:   Physical Exam On exam, both breast incisions are well healed. There is absolutely no erythema or evidence of infection in either breast    Assessment:     Patient with resolved cellulitis    Plan:     She will keep her appointment in 2 weeks

## 2011-10-21 ENCOUNTER — Ambulatory Visit (INDEPENDENT_AMBULATORY_CARE_PROVIDER_SITE_OTHER): Payer: BC Managed Care – PPO | Admitting: General Surgery

## 2011-10-21 ENCOUNTER — Encounter (INDEPENDENT_AMBULATORY_CARE_PROVIDER_SITE_OTHER): Payer: Self-pay | Admitting: General Surgery

## 2011-10-21 DIAGNOSIS — D249 Benign neoplasm of unspecified breast: Secondary | ICD-10-CM

## 2011-10-21 DIAGNOSIS — Z803 Family history of malignant neoplasm of breast: Secondary | ICD-10-CM | POA: Insufficient documentation

## 2011-10-21 HISTORY — DX: Family history of malignant neoplasm of breast: Z80.3

## 2011-10-21 NOTE — Assessment & Plan Note (Signed)
Benign disease both breasts.  Follow up as needed.  No sign of infection.  Advised to cut back on caffeine for fibrocystic breast disease.

## 2011-10-21 NOTE — Progress Notes (Signed)
HISTORY: Pt is s/p bilateral breast excisional biopsies. The left breast is still sore, but the R breast is feeling fine.    EXAM: Bilateral breasts with healing incisions.  No evidence of infection.      Pathology reviewed and demonstrates: Fibroadenoma and fibrocystic changes.     ASSESSMENT AND PLAN:   Family history of breast cancer Refer for genetics evaluation and possible BRCA testing.  Fibroadenoma of breast, left side, biopsy proven. Benign disease both breasts.  Follow up as needed.  No sign of infection.  Advised to cut back on caffeine for fibrocystic breast disease.     Maudry Diego, MD Surgical Oncology, General & Endocrine Surgery [facility], Deforest Hoyles, MD Marlow Baars, MD

## 2011-10-21 NOTE — Patient Instructions (Signed)
Cut back on caffeine.  Follow up with genetics.  Call if any new breast masses are detected.

## 2011-10-21 NOTE — Assessment & Plan Note (Signed)
Refer for genetics evaluation and possible BRCA testing.

## 2011-11-11 ENCOUNTER — Ambulatory Visit: Payer: BC Managed Care – PPO

## 2011-11-11 NOTE — Progress Notes (Signed)
BRCA1/BRCA2 sent to [facility]. TAT ~2 weeks.

## 2011-11-18 ENCOUNTER — Telehealth: Payer: Self-pay | Admitting: Genetic Counselor

## 2011-11-18 ENCOUNTER — Telehealth (INDEPENDENT_AMBULATORY_CARE_PROVIDER_SITE_OTHER): Payer: Self-pay

## 2011-11-18 NOTE — Telephone Encounter (Signed)
Close encounter 

## 2011-11-28 ENCOUNTER — Other Ambulatory Visit (INDEPENDENT_AMBULATORY_CARE_PROVIDER_SITE_OTHER): Payer: Self-pay | Admitting: General Surgery

## 2011-11-28 DIAGNOSIS — D249 Benign neoplasm of unspecified breast: Secondary | ICD-10-CM

## 2011-11-29 ENCOUNTER — Telehealth: Payer: Self-pay | Admitting: *Deleted

## 2011-11-29 NOTE — Telephone Encounter (Signed)
Called pt to schedule High Risk appt and she states that she has an appt Mon. 2/18 w/ Dr. Donell Beers and would like to wait until she speak with her before making this appt.  I will touch base with patient on Tues. 12/03/11.

## 2011-12-02 ENCOUNTER — Ambulatory Visit (INDEPENDENT_AMBULATORY_CARE_PROVIDER_SITE_OTHER): Payer: BC Managed Care – PPO | Admitting: General Surgery

## 2011-12-02 ENCOUNTER — Encounter: Payer: Self-pay | Admitting: *Deleted

## 2011-12-02 ENCOUNTER — Telehealth: Payer: Self-pay | Admitting: *Deleted

## 2011-12-02 DIAGNOSIS — Z803 Family history of malignant neoplasm of breast: Secondary | ICD-10-CM

## 2011-12-02 NOTE — Patient Instructions (Signed)
Meet with Dr. Drue Second to discuss your risk of breast cancer and other issues you are at risk for. Call [phone] for appt.   Dr Wayland Denis of plastic surgery can see you tomorrow or Friday. [phone]  Once you talk to them, please call my office to schedule.

## 2011-12-02 NOTE — Assessment & Plan Note (Addendum)
Pt BRCA negative but does not have the ability to get a test on a pt with breast cancer since she was adopted.  Will refer to high risk clinic to talk to Dr. Welton Flakes.    I would not schedule bilateral mastectomies without the patient discussing cancer risk with Dr. Welton Flakes.  I also think an important step would be to have patient meet with a plastic surgeon to discuss reconstruction.

## 2011-12-02 NOTE — Telephone Encounter (Signed)
Unable to mail before appt letter & packet - gave verbal.

## 2011-12-02 NOTE — Progress Notes (Signed)
HISTORY: Pt presents after referral to genetic counselor for BRCA screening.  She has had bilateral masses resected with fibroadenoma and fibrocystic change found.  She healed reasonably well.  She is very anxious about developing breast cancer.  She is requesting bilateral mastectomies.  She    PERTINENT REVIEW OF SYSTEMS: Otherwise negative.     EXAM: Head: Normocephalic and atraumatic.  Eyes:  Conjunctivae are normal. Pupils are equal, round, and reactive to light. No scleral icterus.  Neck:  Normal range of motion. Neck supple. No tracheal deviation present. No thyromegaly present.  Resp: No respiratory distress, normal effort. Breast:  No masses on either breast, no lymphadenopathy.  Incisions healing well.  Dense breast tissue bilaterally. Abd:  Abdomen is soft, non distended and non tender. No masses are palpable.  There is no rebound and no guarding.  Neurological: Alert and oriented to person, place, and time. Coordination normal.  Skin: Skin is warm and dry. No rash noted. No diaphoretic. No erythema. No pallor.  Psychiatric: Normal mood and affect. Normal behavior. Judgment and thought content normal.    ASSESSMENT AND PLAN:   Family history of breast cancer Pt BRCA negative but does not have the ability to get a test on a pt with breast cancer since she was adopted.  Will refer to high risk clinic to talk to Dr. Khan.    I would not schedule bilateral mastectomies without the patient discussing cancer risk with Dr. Khan.  I also think an important step would be to have patient meet with a plastic surgeon to discuss reconstruction.     30 minutes were spent in counseling with the patient.   Jasraj Lappe L Falan Hensler, MD Surgical Oncology, General & Endocrine Surgery [facility]  GARING,KENDALL, MD, MD Garing, Kendall, MD   

## 2011-12-02 NOTE — Telephone Encounter (Signed)
Patient called me stating that she just left Dr. Arita Miss office and she has an appt w/ the plastic surgeon on Friday and she needed to see Dr. Welton Flakes.  I confirmed 12/06/11 appt w/ pt.

## 2011-12-06 ENCOUNTER — Other Ambulatory Visit: Payer: Self-pay | Admitting: *Deleted

## 2011-12-06 ENCOUNTER — Other Ambulatory Visit: Payer: BC Managed Care – PPO | Admitting: Lab

## 2011-12-06 ENCOUNTER — Encounter: Payer: Self-pay | Admitting: Oncology

## 2011-12-06 ENCOUNTER — Encounter: Payer: BC Managed Care – PPO | Admitting: Oncology

## 2011-12-06 ENCOUNTER — Ambulatory Visit: Payer: BC Managed Care – PPO

## 2011-12-06 ENCOUNTER — Telehealth (INDEPENDENT_AMBULATORY_CARE_PROVIDER_SITE_OTHER): Payer: Self-pay

## 2011-12-06 VITALS — BP 119/80 | HR 82 | Temp 99.0°F | Ht 61.5 in | Wt 227.9 lb

## 2011-12-06 DIAGNOSIS — Z1239 Encounter for other screening for malignant neoplasm of breast: Secondary | ICD-10-CM

## 2011-12-06 DIAGNOSIS — N63 Unspecified lump in unspecified breast: Secondary | ICD-10-CM

## 2011-12-06 NOTE — Telephone Encounter (Signed)
The patient left me a VM today.  She has been to see Oncology and Genetics (Dr. Welton Flakes), and is now ready to schedule her bilateral mastectomies.  I will call the patient back on Monday, 12/09/11.

## 2011-12-09 NOTE — Progress Notes (Signed)
Erroneous encounter

## 2011-12-09 NOTE — Progress Notes (Signed)
[facility]  High Risk Clinic New Patient Evaluation  Name: Tara Clarke            Date: 12/09/2011 MRN: [id number]                DOB: [birth year]  CC:  Dr. Almond Lint  REFERRING PHYSICIAN: Dr. Almond Lint  REASON FOR VISIT: Patient was seen today to discuss her risk assessment for breast cancer in light of her family history and negative genetic testing.  HISTORY OF PRESENT ILLNESS: Tara Clarke is a 36 y.o. female here for hereditary cancer risk assessment. She presents to the clinic with concerns regarding aa possibility of hereditary predisposition to cancer and the implication of this for her own healthcare and for her family. Previous breast/ovarian biopsies: Patient has multiple breast biopsies performed in the past.. Previous pathologyHas never been positive for a primary malignancy.Marland Kitchen   PAST MEDICAL HISTORY:  has a past medical history of Asthma; Lymph node enlargement; Dysrhythmia; PONV (postoperative nausea and vomiting); and Heart palpitations.  PAST SURGICAL HISTORY:  Past Surgical History  Procedure Date  . Knee surgery\ 2003    right knee knee cap removal and pinning  . Cesarean section 1999 and 2001  . Hernia repair age 83 or 7  . Cardiac catheterization     04-10-2011  . Breast biopsy 09/25/2011    Procedure: BREAST BIOPSY;  Surgeon: Almond Lint, MD;  Location: [facility];  Service: General;  Laterality: Bilateral;  Excision Bilateral Breast Masses      CURRENT MEDICATIONS: Ms. Malanowski does not currently have medications on file.  ALLERGIES: Aspirin; Oxycodone-acetaminophen; Tegaderm ag mesh; and Shellfish-derived products  SOCIAL HISTORY:  reports that she has never smoked. She has never used smokeless tobacco. She reports that she does not drink alcohol or use illicit drugs.  HEALTH HABITS: Patient is a nonsmoker nondrinker. She does try to exercise in fact she has joined curves.  REPRODUCTIVE HISTORY: Patient has completed her  family. She Has had an eventful pregnancies.  FAMILY HISTORY:  family history includes Cancer in her maternal aunt and maternal grandmother; Cancer (age of onset:20) in her cousin; and Diabetes in her mother.  She is adopted.  REVIEW OF SYSTEMS:  General: Negative for fever, chills, night sweats,  loss of appetite or weight loss. HEENT: Negative for headaches, sore  throat, difficulty swallowing, blurred vision or problem with hearing or  sinus congestion. Respiratory: Negative for shortness of breath, cough  or dyspnea on exertion. Cardiovascular: Negative for chest pain,  palpitations or pedal edema. GI: Negative for nausea, vomiting,  diarrhea, constipation, change in bowel habits or blood in the stool.  No jaundice. GU: Negative for painful or frequent urination, change in  color of urine, or decreased urinary stream. Integumentary: Negative  for skin rashes or other suspicious skin lesions. Hematologic: Negative  for easy bruisability or bleeding. Musculoskeletal: Negative for  complaints of pain, arthralgias, arthritis or myalgias.  Neurological/psychiatric: Negative for numbness, focal weakness,  balance problems or coordination difficulties. No depression or mood swings.  Breast: No self detected abnormalities in the breast. No nipple discharge, masses or redness of the skin.   PHYSICAL EXAM: BP 119/80  Pulse 82  Temp(Src) 99 F (37.2 C) (Oral)  Ht 5' 1.5" (1.562 m)  Wt 227 lb 14.4 oz (103.375 kg)  BMI 42.36 kg/m2 GENERAL: Well developed, well nourished, in no acute distress.  EENT: No ocular or oral lesions. No stomatitis.  RESPIRATORY: Lungs are  clear to auscultation bilaterally with normal respiratory movement and no accessory muscle use. CARDIAC: No murmur, rub or tachycardia. No upper or lower extremity edema.  GI: Abdomen is soft, no palpable hepatosplenomegaly. No fluid wave. No tenderness. Musculoskeletal: No kyphosis, no tenderness over the spine, ribs or  hips. Lymph: No cervical, infraclavicular, axillary or inguinal adenopathy. Neuro: No focal neurological deficits. Psych: Alert and oriented X 3, appropriate mood and affect.  BREAST EXAM: In the supine position, with the right arm over the head, right nipple is everted. No periareolar edema or nipple discharge. No mass in any quadrant or subareolar region. No redness of the skin. No right axillary adenopathy. With the left arm over the head, left nipple is everted. No periareolar edema or nipple discharge. No mass in any quadrant or subareolar region. No redness of the skin. No left axillary adenopathy.    ASSESSMENT:36 year old female without personal history of breast malignancy. However patient has had multiple bilateral breast excisional biopsies performed. Last one of the pathology revealed a fibroadenoma with fibrocystic tissue changes. Because the patient is adopted and she has found out that she has several biological cousins and other family members with breast cancer patient was sent for genetic counseling and thereafter was did have blood drawn for BRCA1 and 2 analysis. She was negative for both BRCA1 and 2 gene mutation. However due to patient's history of having had multiple biopsies she has decided that she would like to have bilateral mastectomies with immediate reconstruction. Patient is sent to the high-risk clinic as of consultation to discuss her other treatment options  PLAN:  Patient and I had an extensive discussion today regarding the genetics of breast cancer as well as parotic breast cancer and familial breast cancers. Patient certainly does not fit the picture of hereditary breast ovarian cancer syndrome families. But certainly she has a very strong family history of breast cancer not explained by genetics at this point. Patient has met with the genetic counselor and in fact has had genetic testing performed and that is negative. This is not to say that she could not have a  genetic issue going on at this time but we without current knowledge based do not know whether or not genetics are worked in as a cause of this families history of breast cancers. An extensive discussion regarding patient's other options other than having breast surgery performed. We discussed use of chemoprevention with tamoxifen. We discussed exercise healthy adopting healthy diet to reduce her risk. However patient states that she has been thinking about this for quite some time she and her husband have discussed her options at great length. And it is her decision at this point to undergo bilateral mastectomies. She is now doing this out of fear but only out of concern for having to go through multiple biopsies in the past and possibly in the future. She also has very nicely embarked on exercise program and she is going to be losing weight exercising so that she can continue to reduce her risk of other cancers as well as other health care issues. We spent 1 hour face-to-face today discussing her options. At the end of our counseling session of about an hour patient opted that she would like to have her surgery performed as soon as possible. I have sent a message to Dr. Donell Beers as to patient's wishes. I will plan on seeing this patient back on it as needed basis

## 2011-12-10 ENCOUNTER — Encounter (INDEPENDENT_AMBULATORY_CARE_PROVIDER_SITE_OTHER): Payer: BC Managed Care – PPO | Admitting: General Surgery

## 2011-12-12 ENCOUNTER — Encounter: Payer: Self-pay | Admitting: *Deleted

## 2011-12-12 NOTE — Progress Notes (Signed)
Mailed after appt letter to pt. 

## 2011-12-13 DIAGNOSIS — C801 Malignant (primary) neoplasm, unspecified: Secondary | ICD-10-CM | POA: Insufficient documentation

## 2011-12-13 HISTORY — DX: Malignant (primary) neoplasm, unspecified: C80.1

## 2011-12-17 NOTE — Progress Notes (Signed)
This encounter was created in error - please disregard.

## 2011-12-24 ENCOUNTER — Encounter (HOSPITAL_BASED_OUTPATIENT_CLINIC_OR_DEPARTMENT_OTHER): Payer: Self-pay | Admitting: *Deleted

## 2011-12-24 ENCOUNTER — Telehealth (INDEPENDENT_AMBULATORY_CARE_PROVIDER_SITE_OTHER): Payer: Self-pay

## 2011-12-24 NOTE — Pre-Procedure Instructions (Addendum)
Had sleep study 04/06/2011 - results deemed "insignificant"  Will do BMET DOS

## 2011-12-24 NOTE — Telephone Encounter (Signed)
Pt called wanting to know if she needs pre op appt with Dr Donell Beers and also if Dr Donell Beers heard from Dr Welton Flakes re: node bx? I spoke with Dr Donell Beers [id number] 2 questions. Per Dr Donell Beers pt does not need pre op office visit and Dr Donell Beers has sent msg to Dr Welton Flakes re: node biopsy. Dr Donell Beers awaiting call back from Dr Welton Flakes to determine if node bx needs to be done. Pt advised of this.

## 2011-12-24 NOTE — Telephone Encounter (Signed)
error 

## 2012-01-01 ENCOUNTER — Telehealth (INDEPENDENT_AMBULATORY_CARE_PROVIDER_SITE_OTHER): Payer: Self-pay

## 2012-01-01 ENCOUNTER — Encounter (HOSPITAL_BASED_OUTPATIENT_CLINIC_OR_DEPARTMENT_OTHER): Payer: Self-pay | Admitting: *Deleted

## 2012-01-01 ENCOUNTER — Encounter (HOSPITAL_BASED_OUTPATIENT_CLINIC_OR_DEPARTMENT_OTHER): Payer: Self-pay | Admitting: Anesthesiology

## 2012-01-01 ENCOUNTER — Encounter (HOSPITAL_BASED_OUTPATIENT_CLINIC_OR_DEPARTMENT_OTHER): Payer: Self-pay | Admitting: General Surgery

## 2012-01-01 ENCOUNTER — Ambulatory Visit (HOSPITAL_BASED_OUTPATIENT_CLINIC_OR_DEPARTMENT_OTHER): Payer: BC Managed Care – PPO | Admitting: Anesthesiology

## 2012-01-01 ENCOUNTER — Encounter (HOSPITAL_BASED_OUTPATIENT_CLINIC_OR_DEPARTMENT_OTHER)
Admission: RE | Disposition: A | Discharge status: Home / Self Care | Payer: Self-pay | Source: Ambulatory Visit | Attending: General Surgery

## 2012-01-01 ENCOUNTER — Ambulatory Visit (HOSPITAL_BASED_OUTPATIENT_CLINIC_OR_DEPARTMENT_OTHER)
Admission: RE | Admit: 2012-01-01 | Discharge: 2012-01-02 | Disposition: A | Discharge status: Home / Self Care | Payer: BC Managed Care – PPO | Source: Ambulatory Visit | Attending: General Surgery | Admitting: General Surgery

## 2012-01-01 DIAGNOSIS — D249 Benign neoplasm of unspecified breast: Secondary | ICD-10-CM

## 2012-01-01 DIAGNOSIS — N6019 Diffuse cystic mastopathy of unspecified breast: Secondary | ICD-10-CM

## 2012-01-01 DIAGNOSIS — Z4001 Encounter for prophylactic removal of breast: Secondary | ICD-10-CM | POA: Insufficient documentation

## 2012-01-01 DIAGNOSIS — Z803 Family history of malignant neoplasm of breast: Secondary | ICD-10-CM

## 2012-01-01 DIAGNOSIS — N6310 Unspecified lump in the right breast, unspecified quadrant: Secondary | ICD-10-CM

## 2012-01-01 HISTORY — PX: MASTECTOMY: SHX3

## 2012-01-01 HISTORY — DX: Major depressive disorder, single episode, unspecified: F32.9

## 2012-01-01 HISTORY — DX: Depression, unspecified: F32.A

## 2012-01-01 HISTORY — DX: Headache: R51

## 2012-01-01 HISTORY — DX: Malignant (primary) neoplasm, unspecified: C80.1

## 2012-01-01 LAB — POCT I-STAT, CHEM 8
Glucose, Bld: 86 mg/dL (ref 70–99)
HCT: 42 % (ref 36.0–46.0)
Hemoglobin: 14.3 g/dL (ref 12.0–15.0)
Potassium: 4.2 mEq/L (ref 3.5–5.1)
Sodium: 139 mEq/L (ref 135–145)

## 2012-01-01 SURGERY — MASTECTOMY, SIMPLE
Anesthesia: General | Site: Breast | Laterality: Bilateral | Wound class: Clean

## 2012-01-01 MED ORDER — ONDANSETRON HCL 4 MG PO TABS: 4.0000 mg | ORAL_TABLET | Freq: Four times a day (QID) | ORAL | Status: DC | PRN

## 2012-01-01 MED ORDER — AMLODIPINE BESYLATE 5 MG PO TABS: 5.0000 mg | ORAL_TABLET | Freq: Every day | ORAL | Status: DC

## 2012-01-01 MED ORDER — FLUTICASONE-SALMETEROL 500-50 MCG/DOSE IN AEPB
1.0000 | INHALATION_SPRAY | Freq: Two times a day (BID) | RESPIRATORY_TRACT | Status: DC
  Administered 2012-01-01: 1 via RESPIRATORY_TRACT

## 2012-01-01 MED ORDER — HYDROCODONE-ACETAMINOPHEN 5-325 MG PO TABS
1.0000 | ORAL_TABLET | ORAL | Status: DC | PRN
  Administered 2012-01-01 – 2012-01-02 (×5): 2 via ORAL

## 2012-01-01 MED ORDER — ONDANSETRON HCL 4 MG/2ML IJ SOLN
INTRAMUSCULAR | Status: DC | PRN
  Administered 2012-01-01: 4 mg via INTRAVENOUS

## 2012-01-01 MED ORDER — CEFAZOLIN SODIUM-DEXTROSE 2-3 GM-% IV SOLR
2.0000 g | INTRAVENOUS | Status: AC
  Administered 2012-01-01: 2 g via INTRAVENOUS

## 2012-01-01 MED ORDER — MIDAZOLAM HCL 2 MG/2ML IJ SOLN: 1.0000 mg | INTRAMUSCULAR | Status: DC | PRN

## 2012-01-01 MED ORDER — KCL IN DEXTROSE-NACL 20-5-0.45 MEQ/L-%-% IV SOLN
INTRAVENOUS | Status: DC
  Administered 2012-01-01: 15:00:00 via INTRAVENOUS

## 2012-01-01 MED ORDER — CALCIUM CARBONATE-VITAMIN D 250-125 MG-UNIT PO TABS: 1.0000 | ORAL_TABLET | Freq: Every day | ORAL | Status: DC

## 2012-01-01 MED ORDER — SODIUM CHLORIDE 0.9 % IV SOLN
INTRAVENOUS | Status: DC | PRN
  Administered 2012-01-01: 70 mL via INTRAMUSCULAR

## 2012-01-01 MED ORDER — VITAMIN D3 25 MCG (1000 UNIT) PO TABS: 1000.0000 [IU] | ORAL_TABLET | Freq: Every day | ORAL | Status: DC

## 2012-01-01 MED ORDER — FENTANYL CITRATE 0.05 MG/ML IJ SOLN: 50.0000 ug | INTRAMUSCULAR | Status: DC | PRN

## 2012-01-01 MED ORDER — DEXAMETHASONE SODIUM PHOSPHATE 4 MG/ML IJ SOLN
INTRAMUSCULAR | Status: DC | PRN
  Administered 2012-01-01: 10 mg via INTRAVENOUS

## 2012-01-01 MED ORDER — HYDROCODONE-ACETAMINOPHEN 5-325 MG PO TABS: 1.0000 | ORAL_TABLET | ORAL | Status: DC | PRN

## 2012-01-01 MED ORDER — MIDAZOLAM HCL 5 MG/5ML IJ SOLN
INTRAMUSCULAR | Status: DC | PRN
  Administered 2012-01-01: 2 mg via INTRAVENOUS

## 2012-01-01 MED ORDER — BUPIVACAINE-EPINEPHRINE 0.25% -1:200000 IJ SOLN
INTRAMUSCULAR | Status: DC | PRN
  Administered 2012-01-01: 30 mL

## 2012-01-01 MED ORDER — ONDANSETRON HCL 4 MG/2ML IJ SOLN
4.0000 mg | Freq: Four times a day (QID) | INTRAMUSCULAR | Status: DC | PRN
  Administered 2012-01-01: 4 mg via INTRAVENOUS

## 2012-01-01 MED ORDER — ACETAMINOPHEN 10 MG/ML IV SOLN
INTRAVENOUS | Status: DC | PRN
  Administered 2012-01-01: 1000 mg via INTRAVENOUS

## 2012-01-01 MED ORDER — SCOPOLAMINE 1 MG/3DAYS TD PT72
1.0000 | MEDICATED_PATCH | Freq: Once | TRANSDERMAL | Status: DC
  Administered 2012-01-01: 1.5 mg via TRANSDERMAL

## 2012-01-01 MED ORDER — LIDOCAINE HCL (CARDIAC) 20 MG/ML IV SOLN
INTRAVENOUS | Status: DC | PRN
  Administered 2012-01-01: 100 mg via INTRAVENOUS

## 2012-01-01 MED ORDER — ENOXAPARIN SODIUM 40 MG/0.4ML ~~LOC~~ SOLN: 40.0000 mg | SUBCUTANEOUS | Status: DC

## 2012-01-01 MED ORDER — MORPHINE SULFATE 2 MG/ML IJ SOLN
1.0000 mg | INTRAMUSCULAR | Status: DC | PRN
  Administered 2012-01-01: 4 mg via INTRAVENOUS

## 2012-01-01 MED ORDER — EPHEDRINE SULFATE 50 MG/ML IJ SOLN
INTRAMUSCULAR | Status: DC | PRN
  Administered 2012-01-01 (×2): 5 mg via INTRAVENOUS

## 2012-01-01 MED ORDER — LORAZEPAM 2 MG/ML IJ SOLN: 1.0000 mg | Freq: Once | INTRAMUSCULAR | Status: DC | PRN

## 2012-01-01 MED ORDER — ALBUTEROL SULFATE HFA 108 (90 BASE) MCG/ACT IN AERS
2.0000 | INHALATION_SPRAY | RESPIRATORY_TRACT | Status: DC | PRN
  Administered 2012-01-02: 3 via RESPIRATORY_TRACT

## 2012-01-01 MED ORDER — CEFAZOLIN SODIUM-DEXTROSE 2-3 GM-% IV SOLR
2.0000 g | Freq: Three times a day (TID) | INTRAVENOUS | Status: AC
  Administered 2012-01-01 – 2012-01-02 (×2): 2 g via INTRAVENOUS

## 2012-01-01 MED ORDER — PROMETHAZINE HCL 25 MG/ML IJ SOLN
12.5000 mg | Freq: Four times a day (QID) | INTRAMUSCULAR | Status: DC | PRN
  Administered 2012-01-01: 12.5 mg via INTRAVENOUS

## 2012-01-01 MED ORDER — LACTATED RINGERS IV SOLN
INTRAVENOUS | Status: DC
  Administered 2012-01-01 (×3): via INTRAVENOUS

## 2012-01-01 MED ORDER — SERTRALINE HCL 100 MG PO TABS
100.0000 mg | ORAL_TABLET | Freq: Every day | ORAL | Status: DC
  Administered 2012-01-01: 100 mg via ORAL

## 2012-01-01 MED ORDER — KETOROLAC TROMETHAMINE 15 MG/ML IJ SOLN
15.0000 mg | Freq: Four times a day (QID) | INTRAMUSCULAR | Status: DC | PRN
  Administered 2012-01-02 (×2): 15 mg via INTRAVENOUS

## 2012-01-01 MED ORDER — FENTANYL CITRATE 0.05 MG/ML IJ SOLN
INTRAMUSCULAR | Status: DC | PRN
  Administered 2012-01-01 (×2): 25 ug via INTRAVENOUS
  Administered 2012-01-01 (×2): 50 ug via INTRAVENOUS
  Administered 2012-01-01 (×4): 25 ug via INTRAVENOUS

## 2012-01-01 MED ORDER — PROPOFOL 10 MG/ML IV EMUL
INTRAVENOUS | Status: DC | PRN
  Administered 2012-01-01: 250 mg via INTRAVENOUS

## 2012-01-01 MED ORDER — KETOROLAC TROMETHAMINE 15 MG/ML IJ SOLN
15.0000 mg | Freq: Four times a day (QID) | INTRAMUSCULAR | Status: AC
  Administered 2012-01-01: 15 mg via INTRAVENOUS

## 2012-01-01 MED ORDER — HYDROMORPHONE HCL PF 1 MG/ML IJ SOLN
0.2500 mg | INTRAMUSCULAR | Status: DC | PRN
  Administered 2012-01-01 (×2): 0.5 mg via INTRAVENOUS

## 2012-01-01 MED ORDER — ELETRIPTAN HYDROBROMIDE 40 MG PO TABS: 40.0000 mg | ORAL_TABLET | ORAL | Status: DC | PRN

## 2012-01-01 SURGICAL SUPPLY — 69 items
APL SKNCLS STERI-STRIP NONHPOA (GAUZE/BANDAGES/DRESSINGS) ×2
BENZOIN TINCTURE PRP APPL 2/3 (GAUZE/BANDAGES/DRESSINGS) ×2 IMPLANT
BINDER BREAST XLRG (GAUZE/BANDAGES/DRESSINGS) ×1 IMPLANT
BLADE HEX COATED 2.75 (ELECTRODE) ×2 IMPLANT
BLADE SURG 10 STRL SS (BLADE) ×3 IMPLANT
BNDG COHESIVE 4X5 TAN STRL (GAUZE/BANDAGES/DRESSINGS) ×1 IMPLANT
CANISTER SUCTION 1200CC (MISCELLANEOUS) ×3 IMPLANT
CHLORAPREP W/TINT 26ML (MISCELLANEOUS) ×3 IMPLANT
CLIP TI MEDIUM 6 (CLIP) ×3 IMPLANT
CLIP TI WIDE RED SMALL 6 (CLIP) IMPLANT
CLOTH BEACON ORANGE TIMEOUT ST (SAFETY) ×2 IMPLANT
COVER MAYO STAND STRL (DRAPES) ×1 IMPLANT
DECANTER SPIKE VIAL GLASS SM (MISCELLANEOUS) IMPLANT
DRAIN CHANNEL 19F RND (DRAIN) ×3 IMPLANT
DRAPE UTILITY XL STRL (DRAPES) ×2 IMPLANT
DRSG PAD ABDOMINAL 8X10 ST (GAUZE/BANDAGES/DRESSINGS) ×4 IMPLANT
ELECT REM PT RETURN 9FT ADLT (ELECTROSURGICAL) ×2
ELECTRODE REM PT RTRN 9FT ADLT (ELECTROSURGICAL) ×1 IMPLANT
EVACUATOR SILICONE 100CC (DRAIN) ×3 IMPLANT
GAUZE SPONGE 4X4 12PLY STRL LF (GAUZE/BANDAGES/DRESSINGS) ×2 IMPLANT
GLOVE BIO SURGEON STRL SZ7 (GLOVE) ×1 IMPLANT
GLOVE BIOGEL M STER SZ 6 (GLOVE) ×2 IMPLANT
GLOVE BIOGEL PI IND STRL 6.5 (GLOVE) ×1 IMPLANT
GLOVE BIOGEL PI IND STRL 7.0 (GLOVE) IMPLANT
GLOVE BIOGEL PI IND STRL 8 (GLOVE) IMPLANT
GLOVE BIOGEL PI INDICATOR 6.5 (GLOVE) ×1
GLOVE BIOGEL PI INDICATOR 7.0 (GLOVE) ×1
GLOVE BIOGEL PI INDICATOR 8 (GLOVE) ×1
GLOVE ECLIPSE 6.5 STRL STRAW (GLOVE) ×1 IMPLANT
GLOVE SKINSENSE NS SZ8.0 LF (GLOVE) ×1
GLOVE SKINSENSE STRL SZ8.0 LF (GLOVE) IMPLANT
GOWN PREVENTION PLUS XLARGE (GOWN DISPOSABLE) ×4 IMPLANT
GOWN PREVENTION PLUS XXLARGE (GOWN DISPOSABLE) ×2 IMPLANT
GOWN STRL REIN 2XL XLG LVL4 (GOWN DISPOSABLE) ×1 IMPLANT
NDL HYPO 25X1 1.5 SAFETY (NEEDLE) IMPLANT
NDL SPNL 18GX3.5 QUINCKE PK (NEEDLE) IMPLANT
NDL SPNL 22GX3.5 QUINCKE BK (NEEDLE) IMPLANT
NDL SPNL 22GX5 LNG QUINC BK (NEEDLE) IMPLANT
NEEDLE HYPO 25X1 1.5 SAFETY (NEEDLE) IMPLANT
NEEDLE SPNL 18GX3.5 QUINCKE PK (NEEDLE) IMPLANT
NEEDLE SPNL 22GX3.5 QUINCKE BK (NEEDLE) ×2 IMPLANT
NEEDLE SPNL 22GX5 LNG QUINC BK (NEEDLE) ×2 IMPLANT
NS IRRIG 1000ML POUR BTL (IV SOLUTION) ×2 IMPLANT
PACK BASIN DAY SURGERY FS (CUSTOM PROCEDURE TRAY) ×2 IMPLANT
PACK UNIVERSAL I (CUSTOM PROCEDURE TRAY) ×2 IMPLANT
PENCIL BUTTON HOLSTER BLD 10FT (ELECTRODE) ×2 IMPLANT
PIN SAFETY STERILE (MISCELLANEOUS) ×2 IMPLANT
SHEET MEDIUM DRAPE 40X70 STRL (DRAPES) ×1 IMPLANT
SLEEVE SCD COMPRESS KNEE MED (MISCELLANEOUS) ×2 IMPLANT
SPONGE GAUZE 4X4 12PLY (GAUZE/BANDAGES/DRESSINGS) ×2 IMPLANT
SPONGE LAP 18X18 X RAY DECT (DISPOSABLE) ×4 IMPLANT
STAPLER VISISTAT 35W (STAPLE) ×2 IMPLANT
STOCKINETTE IMPERVIOUS LG (DRAPES) ×1 IMPLANT
STRIP CLOSURE SKIN 1/2X4 (GAUZE/BANDAGES/DRESSINGS) ×8 IMPLANT
SUT ETHILON 2 0 FS 18 (SUTURE) ×3 IMPLANT
SUT MNCRL AB 4-0 PS2 18 (SUTURE) ×8 IMPLANT
SUT SILK 2 0 SH (SUTURE) ×1 IMPLANT
SUT VIC AB 3-0 SH 27 (SUTURE)
SUT VIC AB 3-0 SH 27X BRD (SUTURE) IMPLANT
SUT VICRYL 3-0 CR8 SH (SUTURE) ×7 IMPLANT
SUT VICRYL AB 2 0 TIE (SUTURE) ×1 IMPLANT
SUT VICRYL AB 2 0 TIES (SUTURE) ×2
SYR 50ML LL SCALE MARK (SYRINGE) ×1 IMPLANT
SYR CONTROL 10ML LL (SYRINGE) IMPLANT
TOWEL OR 17X24 6PK STRL BLUE (TOWEL DISPOSABLE) ×3 IMPLANT
TOWEL OR NON WOVEN STRL DISP B (DISPOSABLE) ×2 IMPLANT
TUBE CONNECTING 20X1/4 (TUBING) ×2 IMPLANT
WATER STERILE IRR 1000ML POUR (IV SOLUTION) ×1 IMPLANT
YANKAUER SUCT BULB TIP NO VENT (SUCTIONS) ×2 IMPLANT

## 2012-01-01 NOTE — Brief Op Note (Signed)
01/01/2012  2:26 PM  PATIENT:  Tara Clarke  36 y.o. female  PRE-OPERATIVE DIAGNOSIS:  high risk for breast cancer   POST-OPERATIVE DIAGNOSIS:  high risk for breast cancer   PROCEDURE:  Procedure(s) (LRB): TOTAL MASTECTOMY (Bilateral)  SURGEON:  Surgeon(s) and Role:    * Almond Lint, MD - Primary  ANESTHESIA:   local and general  EBL:  Total I/O In: 2700 [I.V.:2700] Out: -   BLOOD ADMINISTERED:none  DRAINS: (19) Blake drain(s) in the subcutaneous mastectomy flaps bilaterally   LOCAL MEDICATIONS USED:  MARCAINE     SPECIMEN:  Source of Specimen:  Bilateral simple mastectomy, additional Left axillary tail  DISPOSITION OF SPECIMEN:  PATHOLOGY  COUNTS:  YES  TOURNIQUET:  * No tourniquets in log *  DICTATION: .Other Dictation: Dictation Number [id number]  PLAN OF CARE: Admit for overnight observation  PATIENT DISPOSITION:  PACU - hemodynamically stable.   Delay start of Pharmacological VTE agent (>24hrs) due to surgical blood loss or risk of bleeding: not applicable

## 2012-01-01 NOTE — Telephone Encounter (Signed)
LMOV that pt would not need a lymph node bx per Drs. Earma Reading.  Surgery as planned.

## 2012-01-01 NOTE — Anesthesia Procedure Notes (Signed)
Procedure Name: LMA Insertion Date/Time: 01/01/2012 11:22 AM Performed by: Burna Cash Pre-anesthesia Checklist: Patient identified, Emergency Drugs available, Suction available and Patient being monitored Patient Re-evaluated:Patient Re-evaluated prior to inductionOxygen Delivery Method: Circle System Utilized Preoxygenation: Pre-oxygenation with 100% oxygen Intubation Type: IV induction Ventilation: Mask ventilation without difficulty LMA: LMA with gastric port inserted LMA Size: 4.0 Number of attempts: 1 Placement Confirmation: positive ETCO2 Tube secured with: Tape Dental Injury: Teeth and Oropharynx as per pre-operative assessment

## 2012-01-01 NOTE — Discharge Instructions (Addendum)
[facility] Office Phone Number [phone]   MASTECTOMY: POST OP INSTRUCTIONS  Always review your discharge instruction sheet given to you by the facility where your surgery was performed.  IF YOU HAVE DISABILITY OR FAMILY LEAVE FORMS, YOU MUST BRING THEM TO THE OFFICE FOR PROCESSING.  DO NOT GIVE THEM TO YOUR DOCTOR.  1. A prescription for pain medication may be given to you upon discharge.  Take your pain medication as prescribed, if needed.  If narcotic pain medicine is not needed, then you may take acetaminophen (Tylenol) or ibuprofen (Advil) as needed. 2. Take your usually prescribed medications unless otherwise directed 3. If you need a refill on your pain medication, please contact your pharmacy.  They will contact our office to request authorization.  Prescriptions will not be filled after 5pm or on week-ends. 4. You should eat very light the first 24 hours after surgery, such as soup, crackers, pudding, etc.  Resume your normal diet the day after surgery. 5. Most patients will experience some swelling and bruising in the breast.  Ice packs and a good support bra will help.  Swelling and bruising can take several days to resolve.  6. It is common to experience some constipation if taking pain medication after surgery.  Increasing fluid intake and taking a stool softener will usually help or prevent this problem from occurring.  A mild laxative (Milk of Magnesia or Miralax) should be taken according to package directions if there are no bowel movements after 48 hours. 7. Unless discharge instructions indicate otherwise, you may remove your bandages 48 hours after surgery, and you may shower at that time.  You may have steri-strips (small skin tapes) in place directly over the incision.  These strips should be left on the skin for 10-14 days.   Any sutures or staples will be removed at the office during your follow-up visit. 8. ACTIVITIES:  You may resume regular daily  activities (gradually increasing) beginning the next day.  Wearing a good support bra or sports bra (or the breast binder) minimizes pain and swelling.  You may have sexual intercourse when it is comfortable. a. You may drive when you no longer are taking prescription pain medication, you can comfortably wear a seatbelt, and you can safely maneuver your car and apply brakes. b. RETURN TO WORK:  __________1-2 week_______________ 9. You should see your doctor in the office for a follow-up appointment approximately two weeks after your surgery.  Your doctor's nurse will typically make your follow-up appointment when she calls you with your pathology report.  Expect your pathology report 2-3 business days after your surgery.  You may call to check if you do not hear from [place] after three days.   WHEN TO CALL YOUR DOCTOR: 1. Fever over 101.0 2. Nausea and/or vomiting. 3. Extreme swelling or bruising. 4. Continued bleeding from incision. 5. Increased pain, redness, or drainage from the incision.  The clinic staff is available to answer your questions during regular business hours.  Please don't hesitate to call and ask to speak to one of the nurses for clinical concerns.  If you have a medical emergency, go to the nearest emergency room or call 911.  A surgeon from [facility] is always on call at the hospital.  For further questions, please visit [email or web address]     [facility]  [address] [place], [place] [address] [phone]   Post Anesthesia Home Care Instructions  Activity: Get plenty of rest  for the remainder of the day. A responsible adult should stay with you for 24 hours following the procedure.  For the next 24 hours, DO NOT: -Drive a car -Advertising copywriter -Drink alcoholic beverages -Take any medication unless instructed by your physician -Make any legal decisions or sign important papers.  Meals: Start with liquid foods such  as gelatin or soup. Progress to regular foods as tolerated. Avoid greasy, spicy, heavy foods. If nausea and/or vomiting occur, drink only clear liquids until the nausea and/or vomiting subsides. Call your physician if vomiting continues.  Special Instructions/Symptoms: Your throat may feel dry or sore from the anesthesia or the breathing tube placed in your throat during surgery. If this causes discomfort, gargle with warm salt water. The discomfort should disappear within 24 hours.  About my Jackson-Pratt Bulb Drain  What is a Jackson-Pratt bulb?   A Jackson-Pratt is a soft, round device used to collect drainage. It is connected to a long, thin drainage catheter, which is held in place by one or two small stiches near your surgical incision site. When the bulb is squeezed, it forms a vacuum, forcing the drainage to empty into the bulb.  Emptying the Jackson-Pratt bulb-   To empty the bulb: 1. Release the plug on the top of the bulb. 2. Pour the bulb's contents into a measuring container which your nurse will provide. 3. Record the time emptied and amount of drainage. Empty the drain(s) as often as your     doctor or nurse recommends.  Date                  Time                    Amount (Drain 1)                 Amount (Drain 2)  _____________________________________________________________________  _____________________________________________________________________  _____________________________________________________________________  _____________________________________________________________________  _____________________________________________________________________  _____________________________________________________________________  _____________________________________________________________________  _____________________________________________________________________  Squeezing the Jackson-Pratt Bulb-    To squeeze the bulb: 1. Make sure the plug at the  top of the bulb is open. 2. Squeeze the bulb tightly in your fist. You will hear air squeezing from the bulb. 3. Replace the plug while the bulb is squeezed. 4. Use a safety pin to attach the bulb to your clothing. This will keep the catheter from     pulling at the bulb insertion site.  When to call your doctor-   Call your doctor if:  Drain site becomes red, swollen or hot.  You have a fever greater than 101 degrees F.  There is oozing at the drain site.  Drain falls out (apply a guaze bandage over the drain hole and secure it with tape).  Drainage increases daily not related to activity patterns. (You will usually have more drainage when you are active than when you are resting.)  Drainage has a bad odor.

## 2012-01-01 NOTE — Anesthesia Preprocedure Evaluation (Signed)
Anesthesia Evaluation  Patient identified by MRN, date of birth, ID band Patient awake    Reviewed: Allergy & Precautions, H&P , NPO status , Patient's Chart, lab work & pertinent test results  History of Anesthesia Complications (+) PONV  Airway Mallampati: II TM Distance: >3 FB Neck ROM: Full    Dental   Pulmonary asthma ,    Pulmonary exam normal       Cardiovascular hypertension,     Neuro/Psych  Headaches, Depression    GI/Hepatic   Endo/Other  Morbid obesity  Renal/GU      Musculoskeletal   Abdominal (+) + obese,   Peds  Hematology   Anesthesia Other Findings   Reproductive/Obstetrics                           Anesthesia Physical Anesthesia Plan  ASA: III  Anesthesia Plan: General   Post-op Pain Management:    Induction: Intravenous  Airway Management Planned: LMA  Additional Equipment:   Intra-op Plan:   Post-operative Plan: Extubation in OR  Informed Consent: I have reviewed the patients History and Physical, chart, labs and discussed the procedure including the risks, benefits and alternatives for the proposed anesthesia with the patient or authorized representative who has indicated his/her understanding and acceptance.     Plan Discussed with: CRNA and Surgeon  Anesthesia Plan Comments:         Anesthesia Quick Evaluation

## 2012-01-01 NOTE — Anesthesia Postprocedure Evaluation (Signed)
  Anesthesia Post-op Note  Patient: Tara Clarke  Procedure(s) Performed: Procedure(s) (LRB): TOTAL MASTECTOMY (Bilateral)  Patient Location: PACU  Anesthesia Type: General  Level of Consciousness: awake and alert   Airway and Oxygen Therapy: Patient Spontanous Breathing  Post-op Pain: mild  Post-op Assessment: Post-op Vital signs reviewed, Patient's Cardiovascular Status Stable, Respiratory Function Stable, Patent Airway, No signs of Nausea or vomiting and Pain level controlled  Post-op Vital Signs: stable  Complications: No apparent anesthesia complications

## 2012-01-01 NOTE — Progress Notes (Signed)
Pt with c/o slight wheeze and tightness to chest.Pt noted to have slight wheeze to left chest. Dr. Gypsy Balsam notified and pt. given home Albuterol inhailer as per Dr. Gypsy Balsam. Ok to move pt. To [facility].

## 2012-01-01 NOTE — Progress Notes (Signed)
Spoke with Dr. Donell Beers in regards to CBC entered in [facility] for 1516 on 01/01/12-not to be drawn as per Dr. Donell Beers.

## 2012-01-01 NOTE — Interval H&P Note (Signed)
History and Physical Interval Note:  01/01/2012 11:00 AM  Tara Clarke  has presented today for surgery, with the diagnosis of high risk breast cancer   The various methods of treatment have been discussed with the patient and family. After consideration of risks, benefits and other options for treatment, the patient has consented to  Procedure(s) (LRB): TOTAL MASTECTOMY (Bilateral) as a surgical intervention .  The patients' history has been reviewed, patient examined, no change in status, stable for surgery.  I have reviewed the patients' chart and labs.  Questions were answered to the patient's satisfaction.   The patient has seen plastic surgery, genetics, and high risk oncology and pt wishes to proceed with bilateral mastectomy with no reconstruction.  She expresses that psychologically, she is prepared to not have breasts.    [name]

## 2012-01-01 NOTE — Transfer of Care (Signed)
Immediate Anesthesia Transfer of Care Note  Patient: Tara Clarke  Procedure(s) Performed: Procedure(s) (LRB): TOTAL MASTECTOMY (Bilateral)  Patient Location: PACU  Anesthesia Type: General  Level of Consciousness: awake and alert   Airway & Oxygen Therapy: Patient Spontanous Breathing and Patient connected to face mask oxygen  Post-op Assessment: Report given to PACU RN and Post -op Vital signs reviewed and stable  Post vital signs: Reviewed and stable  Complications: No apparent anesthesia complications

## 2012-01-01 NOTE — H&P (View-Only) (Signed)
HISTORY: Pt presents after referral to genetic counselor for BRCA screening.  She has had bilateral masses resected with fibroadenoma and fibrocystic change found.  She healed reasonably well.  She is very anxious about developing breast cancer.  She is requesting bilateral mastectomies.  She    PERTINENT REVIEW OF SYSTEMS: Otherwise negative.     EXAM: Head: Normocephalic and atraumatic.  Eyes:  Conjunctivae are normal. Pupils are equal, round, and reactive to light. No scleral icterus.  Neck:  Normal range of motion. Neck supple. No tracheal deviation present. No thyromegaly present.  Resp: No respiratory distress, normal effort. Breast:  No masses on either breast, no lymphadenopathy.  Incisions healing well.  Dense breast tissue bilaterally. Abd:  Abdomen is soft, non distended and non tender. No masses are palpable.  There is no rebound and no guarding.  Neurological: Alert and oriented to person, place, and time. Coordination normal.  Skin: Skin is warm and dry. No rash noted. No diaphoretic. No erythema. No pallor.  Psychiatric: Normal mood and affect. Normal behavior. Judgment and thought content normal.    ASSESSMENT AND PLAN:   Family history of breast cancer Pt BRCA negative but does not have the ability to get a test on a pt with breast cancer since she was adopted.  Will refer to high risk clinic to talk to Dr. Welton Flakes.    I would not schedule bilateral mastectomies without the patient discussing cancer risk with Dr. Welton Flakes.  I also think an important step would be to have patient meet with a plastic surgeon to discuss reconstruction.     30 minutes were spent in counseling with the patient.   Maudry Diego, MD Surgical Oncology, General & Endocrine Surgery [facility], Deforest Hoyles, MD, MD Marlow Baars, MD

## 2012-01-02 NOTE — Op Note (Signed)
NAME:  Tara Clarke, Tara Clarke                   ACCOUNT NO.:  MEDICAL RECORD NO.:  [id number]  LOCATION:                                 FACILITY:  PHYSICIAN:  Almond Lint, MD       DATE OF BIRTH:  1976/02/05  DATE OF PROCEDURE: DATE OF DISCHARGE:                              OPERATIVE REPORT   PREOPERATIVE DIAGNOSIS:  High risk for breast cancer, multiple prior breast biopsies.  POSTOPERATIVE DIAGNOSIS:  High risk for breast cancer, multiple prior breast biopsies.  PROCEDURE PERFORMED:  Bilateral simple mastectomies.  SURGEON:  Almond Lint, MD  ASSISTANT:  None.  ANESTHESIA:  General and local  0.25% Marcaine with epinephrine.  ESTIMATED BLOOD LOSS:  50 mL.  SPECIMENS:  Right simple mastectomy, left simple mastectomy, additional left axillary tail to Pathology.  Counts were correct.  No complications were known.  PROCEDURE IN DETAIL:  Ms. Pardi was identified in the holding area, and taken to the operating room, where she was placed upon the operating room table. General anesthesia was induced.  Her bilateral breasts and upper chest was prepped and draped in sterile fashion.  Time-out was performed according to surgical safety check list.  When all was correct, we continued.  The borders of the breast were drawn out including the lateral sternum, the inferior border of the clavicle, the latissimus muscle, and the inframammary fold.  The right breast was approached first.    The incision line was drawn out using a silk suture to make sure that the inferior and superior flaps were equidistant.  The skin was incised with #10 blade.  The dermis was opened with the cautery. Marcaine mixed with saline was injected underneath the flaps. Mastectomy hooks were used to elevate the skin and curved Mayo scissors were used to dissect the superior flap to the inferior border of the clavicle.  When bleeding was encountered, the cautery was used.  This was done on the entire  superior flap.  The inferior flap was then addressed by opening up the inferior incision with a #10 blade, opening the deep dermis with the cautery, and then infiltrating with Marcaine in similar fashion.  Again the mastectomy hooks were relocated and the curved Mayo scissors were used to dissect the breast down to the serratus anterior fascia.  Once this had been done all the way inferiorly and superiorly, the pectoralis fascia was elevated and taken off the pectoralis muscle from medial to lateral.  The perforating vessels were carefully cauterized and required several clips.  The lateral aspect of the breast was taken off with the cautery.  The wound was copiously irrigated and hemostasis was achieved with the cautery. The flaps were examined multiple times.  A 19 Blake drain was placed in the lateral inferior chest wall.  This was secured with a 2-0 nylon. The skin was reapproximated with penetrating towel clips.  There was excess laxity of the skin and so this was trimmed back with the Metzenbaum scissors.  Once these were held into place, the skin was closed with interrupted 3-0 Vicryl deep dermal sutures and running 4-0 Monocryl subcuticular sutures.  The left breast was then dressed  in the exact same fashion.  The wounds were then cleaned, dried, and dressed with benzoin, Steri-Strips, gauze, ABDs, and breast binder.  Suction held on both drains.  The patient tolerated procedure well.  There was good blood supply to both sides and inferior and superior skin flaps. Counts were correct.  The patient was awakened from anesthesia and taken to PACU in stable condition.  Needle, sponge, and instrument counts were correct x2.     Almond Lint, MD     FB/MEDQ  D:  01/01/2012  T:  01/01/2012  Job:  [id number]

## 2012-01-05 NOTE — Progress Notes (Signed)
Quick Note:  Please let pt know no evidence of cancer in either breast. ______

## 2012-01-06 ENCOUNTER — Encounter (HOSPITAL_BASED_OUTPATIENT_CLINIC_OR_DEPARTMENT_OTHER): Payer: Self-pay

## 2012-01-06 ENCOUNTER — Telehealth (INDEPENDENT_AMBULATORY_CARE_PROVIDER_SITE_OTHER): Payer: Self-pay

## 2012-01-06 NOTE — Telephone Encounter (Signed)
Close encounter 

## 2012-01-06 NOTE — Telephone Encounter (Signed)
Pt notified of pathology results and scheduled for post op appointment.

## 2012-01-07 ENCOUNTER — Ambulatory Visit (INDEPENDENT_AMBULATORY_CARE_PROVIDER_SITE_OTHER): Payer: BC Managed Care – PPO | Admitting: Surgery

## 2012-01-07 ENCOUNTER — Encounter (INDEPENDENT_AMBULATORY_CARE_PROVIDER_SITE_OTHER): Payer: BC Managed Care – PPO | Admitting: General Surgery

## 2012-01-07 ENCOUNTER — Encounter (INDEPENDENT_AMBULATORY_CARE_PROVIDER_SITE_OTHER): Payer: Self-pay | Admitting: Surgery

## 2012-01-07 VITALS — BP 126/84 | HR 70 | Temp 97.4°F | Resp 18 | Ht 61.5 in | Wt 218.6 lb

## 2012-01-07 DIAGNOSIS — Z9889 Other specified postprocedural states: Secondary | ICD-10-CM

## 2012-01-07 NOTE — Progress Notes (Signed)
Pt comes in for drain check after bilateral mastectomy.  Noticed redness at drain sites and decreased drainage from both drains.  Minimal pain.  Exam mastectomy incisions clean dry intact.  Drain sites minimal redness.  Drains stripped.    S/P bilateral mastectomy  RETURN 1 WEEK FOR DRAIN CHECK.

## 2012-01-07 NOTE — Patient Instructions (Signed)
Return next week for wound check.

## 2012-01-08 ENCOUNTER — Telehealth (INDEPENDENT_AMBULATORY_CARE_PROVIDER_SITE_OTHER): Payer: Self-pay

## 2012-01-08 NOTE — Telephone Encounter (Signed)
Pt called this morning in pain.  She says the Hydrocodone caused mouth sores, and she cannot take Oxycodone either.  Dr. Donell Beers authorized Dilaudid 2mg  1-2 tablets q 4 hrs prn pain, #50 w/ no refill.  Rx was written by Dr. Abbey Chatters, and pt is aware.  Her husband will p/u Rx later today.

## 2012-01-09 ENCOUNTER — Telehealth (INDEPENDENT_AMBULATORY_CARE_PROVIDER_SITE_OTHER): Payer: Self-pay

## 2012-01-09 NOTE — Telephone Encounter (Signed)
Called [facility] to cancel pt's 01/17/12 mammogram, and made them aware she had bilateral mastectomies so no more mammograms.

## 2012-01-10 ENCOUNTER — Encounter (HOSPITAL_COMMUNITY): Payer: Self-pay | Admitting: Emergency Medicine

## 2012-01-10 ENCOUNTER — Inpatient Hospital Stay (HOSPITAL_COMMUNITY)
Admission: EM | Admit: 2012-01-10 | Discharge: 2012-01-15 | DRG: 541 | Disposition: A | Discharge status: Home / Self Care | Payer: BC Managed Care – PPO | Attending: Internal Medicine | Admitting: Internal Medicine

## 2012-01-10 DIAGNOSIS — R110 Nausea: Secondary | ICD-10-CM | POA: Diagnosis present

## 2012-01-10 DIAGNOSIS — D649 Anemia, unspecified: Secondary | ICD-10-CM | POA: Diagnosis present

## 2012-01-10 DIAGNOSIS — R6510 Systemic inflammatory response syndrome (SIRS) of non-infectious origin without acute organ dysfunction: Secondary | ICD-10-CM | POA: Diagnosis present

## 2012-01-10 DIAGNOSIS — L03313 Cellulitis of chest wall: Secondary | ICD-10-CM

## 2012-01-10 DIAGNOSIS — E669 Obesity, unspecified: Secondary | ICD-10-CM | POA: Diagnosis present

## 2012-01-10 DIAGNOSIS — G8918 Other acute postprocedural pain: Secondary | ICD-10-CM

## 2012-01-10 DIAGNOSIS — J45909 Unspecified asthma, uncomplicated: Secondary | ICD-10-CM | POA: Diagnosis present

## 2012-01-10 DIAGNOSIS — Z6841 Body Mass Index (BMI) 40.0 and over, adult: Secondary | ICD-10-CM

## 2012-01-10 DIAGNOSIS — J189 Pneumonia, unspecified organism: Principal | ICD-10-CM | POA: Diagnosis present

## 2012-01-10 LAB — DIFFERENTIAL
Eosinophils Absolute: 0.1 10*3/uL (ref 0.0–0.7)
Eosinophils Relative: 2 % (ref 0–5)
Lymphocytes Relative: 19 % (ref 12–46)
Lymphs Abs: 1.4 10*3/uL (ref 0.7–4.0)
Monocytes Absolute: 0.7 10*3/uL (ref 0.1–1.0)

## 2012-01-10 LAB — CBC
HCT: 34.3 % — ABNORMAL LOW (ref 36.0–46.0)
MCH: 27.3 pg (ref 26.0–34.0)
MCV: 84.5 fL (ref 78.0–100.0)
RBC: 4.06 MIL/uL (ref 3.87–5.11)
RDW: 16.1 % — ABNORMAL HIGH (ref 11.5–15.5)
WBC: 7.4 10*3/uL (ref 4.0–10.5)

## 2012-01-10 MED ORDER — IPRATROPIUM BROMIDE 0.02 % IN SOLN
0.5000 mg | Freq: Once | RESPIRATORY_TRACT | Status: AC
  Administered 2012-01-11: 0.5 mg via RESPIRATORY_TRACT
  Filled 2012-01-10: qty 2.5

## 2012-01-10 MED ORDER — ONDANSETRON HCL 4 MG/2ML IJ SOLN
4.0000 mg | INTRAMUSCULAR | Status: DC | PRN
  Administered 2012-01-11: 4 mg via INTRAVENOUS
  Filled 2012-01-10: qty 2

## 2012-01-10 MED ORDER — SODIUM CHLORIDE 0.9 % IV BOLUS (SEPSIS)
500.0000 mL | Freq: Once | INTRAVENOUS | Status: AC
  Administered 2012-01-11: 500 mL via INTRAVENOUS

## 2012-01-10 MED ORDER — ALBUTEROL SULFATE (5 MG/ML) 0.5% IN NEBU
5.0000 mg | INHALATION_SOLUTION | Freq: Once | RESPIRATORY_TRACT | Status: AC
  Administered 2012-01-11: 5 mg via RESPIRATORY_TRACT
  Filled 2012-01-10: qty 1

## 2012-01-10 MED ORDER — SODIUM CHLORIDE 0.9 % IV SOLN
INTRAVENOUS | Status: DC
  Administered 2012-01-11: via INTRAVENOUS

## 2012-01-10 MED ORDER — FENTANYL CITRATE 0.05 MG/ML IJ SOLN
25.0000 ug | INTRAMUSCULAR | Status: AC | PRN
  Administered 2012-01-11: via INTRAVENOUS
  Administered 2012-01-11: 25 ug via INTRAVENOUS
  Filled 2012-01-10 (×2): qty 2

## 2012-01-10 NOTE — ED Notes (Addendum)
Patient had double mastectomy on March 20th; patient reports fever, light-headedness, and yellow-colored drainage from drains/tubes that started this afternoon.  Patient was seen by primary care physician on Tuesday; reports that these symptoms did not occur on Tuesday. Denies chest pain, shortness of breath.  Reports pain around surgery site, but states that it is no different that her post-op pain has been.

## 2012-01-11 ENCOUNTER — Emergency Department (HOSPITAL_COMMUNITY): Payer: BC Managed Care – PPO

## 2012-01-11 ENCOUNTER — Encounter (HOSPITAL_COMMUNITY): Payer: Self-pay | Admitting: Radiology

## 2012-01-11 DIAGNOSIS — R509 Fever, unspecified: Secondary | ICD-10-CM

## 2012-01-11 DIAGNOSIS — R05 Cough: Secondary | ICD-10-CM

## 2012-01-11 DIAGNOSIS — J189 Pneumonia, unspecified organism: Principal | ICD-10-CM

## 2012-01-11 DIAGNOSIS — R059 Cough, unspecified: Secondary | ICD-10-CM

## 2012-01-11 HISTORY — DX: Pneumonia, unspecified organism: J18.9

## 2012-01-11 LAB — URINALYSIS, ROUTINE W REFLEX MICROSCOPIC
Bilirubin Urine: NEGATIVE
Glucose, UA: NEGATIVE mg/dL
Ketones, ur: NEGATIVE mg/dL
Protein, ur: NEGATIVE mg/dL
pH: 8 (ref 5.0–8.0)

## 2012-01-11 LAB — PROCALCITONIN: Procalcitonin: <0.1 ng/mL

## 2012-01-11 LAB — BASIC METABOLIC PANEL
CO2: 24 mEq/L (ref 19–32)
Calcium: 9.3 mg/dL (ref 8.4–10.5)
Chloride: 100 mEq/L (ref 96–112)
Creatinine, Ser: 0.67 mg/dL (ref 0.50–1.10)
Glucose, Bld: 95 mg/dL (ref 70–99)

## 2012-01-11 LAB — EXPECTORATED SPUTUM ASSESSMENT W GRAM STAIN, RFLX TO RESP C

## 2012-01-11 MED ORDER — GUAIFENESIN 100 MG/5ML PO SOLN
10.0000 mL | Freq: Four times a day (QID) | ORAL | Status: DC | PRN
  Administered 2012-01-13 – 2012-01-15 (×3): 200 mg via ORAL
  Filled 2012-01-11 (×3): qty 10

## 2012-01-11 MED ORDER — ALUM & MAG HYDROXIDE-SIMETH 200-200-20 MG/5ML PO SUSP: 30.0000 mL | Freq: Four times a day (QID) | ORAL | Status: DC | PRN

## 2012-01-11 MED ORDER — VANCOMYCIN HCL IN DEXTROSE 1-5 GM/200ML-% IV SOLN
1000.0000 mg | Freq: Three times a day (TID) | INTRAVENOUS | Status: DC
  Administered 2012-01-11: 1000 mg via INTRAVENOUS
  Filled 2012-01-11 (×3): qty 200

## 2012-01-11 MED ORDER — IPRATROPIUM BROMIDE 0.02 % IN SOLN
0.5000 mg | Freq: Once | RESPIRATORY_TRACT | Status: AC
  Administered 2012-01-11: 0.5 mg via RESPIRATORY_TRACT
  Filled 2012-01-11: qty 2.5

## 2012-01-11 MED ORDER — LEVOFLOXACIN IN D5W 750 MG/150ML IV SOLN
750.0000 mg | INTRAVENOUS | Status: DC
  Administered 2012-01-11: 750 mg via INTRAVENOUS
  Filled 2012-01-11 (×3): qty 150

## 2012-01-11 MED ORDER — ACETAMINOPHEN 650 MG RE SUPP: 650.0000 mg | Freq: Four times a day (QID) | RECTAL | Status: DC | PRN

## 2012-01-11 MED ORDER — ALBUTEROL SULFATE HFA 108 (90 BASE) MCG/ACT IN AERS
2.0000 | INHALATION_SPRAY | RESPIRATORY_TRACT | Status: DC | PRN
  Administered 2012-01-11 – 2012-01-14 (×4): 2 via RESPIRATORY_TRACT
  Filled 2012-01-11: qty 6.7

## 2012-01-11 MED ORDER — DEXTROSE 5 % IV SOLN
1.0000 g | Freq: Two times a day (BID) | INTRAVENOUS | Status: DC
  Administered 2012-01-11: 1 g via INTRAVENOUS
  Filled 2012-01-11 (×4): qty 1

## 2012-01-11 MED ORDER — HYDROMORPHONE HCL PF 1 MG/ML IJ SOLN
1.0000 mg | INTRAMUSCULAR | Status: DC | PRN
  Administered 2012-01-11 – 2012-01-13 (×7): 1 mg via INTRAVENOUS
  Filled 2012-01-11 (×7): qty 1

## 2012-01-11 MED ORDER — ACETAMINOPHEN 325 MG PO TABS
650.0000 mg | ORAL_TABLET | Freq: Four times a day (QID) | ORAL | Status: DC | PRN
  Administered 2012-01-11 – 2012-01-15 (×4): 650 mg via ORAL
  Filled 2012-01-11 (×4): qty 2

## 2012-01-11 MED ORDER — GUAIFENESIN-CODEINE 100-10 MG/5ML PO SOLN: 5.0000 mL | Freq: Four times a day (QID) | ORAL | Status: DC | PRN

## 2012-01-11 MED ORDER — DEXTROSE 5 % IV SOLN
1.0000 g | INTRAVENOUS | Status: DC
  Administered 2012-01-11 – 2012-01-13 (×3): 1 g via INTRAVENOUS
  Filled 2012-01-11 (×4): qty 10

## 2012-01-11 MED ORDER — EPINEPHRINE 0.15 MG/0.3ML IJ DEVI
0.1500 mg | INTRAMUSCULAR | Status: DC | PRN
  Filled 2012-01-11: qty 0.3

## 2012-01-11 MED ORDER — FLUTICASONE-SALMETEROL 500-50 MCG/DOSE IN AEPB
1.0000 | INHALATION_SPRAY | Freq: Two times a day (BID) | RESPIRATORY_TRACT | Status: DC
  Administered 2012-01-11 – 2012-01-15 (×8): 1 via RESPIRATORY_TRACT
  Filled 2012-01-11: qty 14

## 2012-01-11 MED ORDER — SERTRALINE HCL 100 MG PO TABS
100.0000 mg | ORAL_TABLET | Freq: Every day | ORAL | Status: DC
  Administered 2012-01-11 – 2012-01-14 (×4): 100 mg via ORAL
  Filled 2012-01-11 (×5): qty 1

## 2012-01-11 MED ORDER — FENTANYL CITRATE 0.05 MG/ML IJ SOLN: 25.0000 ug | INTRAMUSCULAR | Status: DC | PRN

## 2012-01-11 MED ORDER — ALBUTEROL SULFATE (5 MG/ML) 0.5% IN NEBU
5.0000 mg | INHALATION_SOLUTION | Freq: Once | RESPIRATORY_TRACT | Status: AC
  Administered 2012-01-11: 5 mg via RESPIRATORY_TRACT
  Filled 2012-01-11: qty 1

## 2012-01-11 MED ORDER — DEXTROSE 5 % IV SOLN
500.0000 mg | INTRAVENOUS | Status: DC
  Administered 2012-01-11 – 2012-01-13 (×3): 500 mg via INTRAVENOUS
  Filled 2012-01-11 (×4): qty 500

## 2012-01-11 MED ORDER — IOHEXOL 350 MG/ML SOLN
100.0000 mL | Freq: Once | INTRAVENOUS | Status: AC | PRN
  Administered 2012-01-11: 100 mL via INTRAVENOUS

## 2012-01-11 MED ORDER — HEPARIN SODIUM (PORCINE) 5000 UNIT/ML IJ SOLN
5000.0000 [IU] | Freq: Three times a day (TID) | INTRAMUSCULAR | Status: DC
  Administered 2012-01-11 – 2012-01-14 (×9): 5000 [IU] via SUBCUTANEOUS
  Filled 2012-01-11 (×12): qty 1

## 2012-01-11 MED ORDER — ELETRIPTAN HYDROBROMIDE 40 MG PO TABS
40.0000 mg | ORAL_TABLET | ORAL | Status: DC | PRN
  Filled 2012-01-11: qty 1

## 2012-01-11 MED ORDER — ONDANSETRON HCL 4 MG PO TABS
4.0000 mg | ORAL_TABLET | Freq: Four times a day (QID) | ORAL | Status: DC | PRN
  Administered 2012-01-13 – 2012-01-14 (×4): 4 mg via ORAL
  Filled 2012-01-11 (×4): qty 1

## 2012-01-11 MED ORDER — GUAIFENESIN 100 MG/5ML PO SYRP
200.0000 mg | ORAL_SOLUTION | Freq: Four times a day (QID) | ORAL | Status: DC | PRN
  Filled 2012-01-11: qty 118

## 2012-01-11 MED ORDER — SODIUM CHLORIDE 0.9 % IV SOLN
INTRAVENOUS | Status: AC
  Administered 2012-01-11: 17:00:00 via INTRAVENOUS

## 2012-01-11 MED ORDER — ONDANSETRON HCL 4 MG/2ML IJ SOLN
4.0000 mg | Freq: Four times a day (QID) | INTRAMUSCULAR | Status: DC | PRN
  Administered 2012-01-11 – 2012-01-15 (×9): 4 mg via INTRAVENOUS
  Filled 2012-01-11 (×9): qty 2

## 2012-01-11 NOTE — ED Notes (Signed)
Pt requesting water to drink. Ok per Dr. Clarene Duke. Pt given cup of ice water.

## 2012-01-11 NOTE — H&P (Signed)
Hospital Admission Note Date: 01/11/2012  Patient name: Tara Clarke Medical record number: [id number] Date of birth: [birth year] Age: 36 y.o. Gender: female PCP: Provider Not In System  Medical Service: B - [address]  Attending physician:   Dr. Josem Kaufmann  1st Contact:   Dr. Dorise Hiss [phone] 2nd Contact:   Dr. Dorthula Rue [phone] After 5 pm or weekends: 1st Contact:      Pager: [phone] 2nd Contact:      Pager: [phone]  Chief Complaint:  Cough, SOB, fever  History of Present Illness: The patient is a 36 YO female who comes in with 2 day history of cough, SOB, fevers. Cough is non-productive. She had bilateral mastectomy on 3/20 for recurrent breast masses (no cancer identified) with strong family history of breast cancer. She was healing appropriately and saw her surgeon for 1 week follow up on 3/26. 2 drains are still in place and intact with some mild erythema and not infected looking. Pt has appointment to remove drains on Monday with her surgeon, Dr. Donell Beers. She did use ibuprofen and tylenol for post-op pain until Wednesday when she did get dilaudid PO from her surgeon so she could have day free of pain on Thursday when her husband was able to care for the children. She woke up Friday with cough, SOB, feeling bad. Breathing did get worse and she does have past medical history significant for asthma, no intubation history. She states she does usually get pneumonia once a year and does not normally stay in the hospital for it. She has last been hospitalized in September. She had less than 24 hour stay for mastectomy at out-pt surgical center. Non-smoker, never-smoker. Was up and walking and doing housework Mon, [place], [place] of this week. Some nausea, no vomiting, no diarrhea, no chest pain, no abdominal pain. Pain at site of mastectomy especially with coughing. No new leg swelling.   Meds: Medications Prior to Admission  Medication Dose Route Frequency Provider Last Rate Last Dose  . 0.9 %   sodium chloride infusion   Intravenous Continuous Laray Anger, DO 125 mL/hr at 01/11/12 0007    . albuterol (PROVENTIL) (5 MG/ML) 0.5% nebulizer solution 5 mg  5 mg Nebulization Once Laray Anger, DO   5 mg at 01/11/12 0020  . albuterol (PROVENTIL) (5 MG/ML) 0.5% nebulizer solution 5 mg  5 mg Nebulization Once Laray Anger, DO   5 mg at 01/11/12 0536  . ceFEPIme (MAXIPIME) 1 g in dextrose 5 % 50 mL IVPB  1 g Intravenous Q12H Laray Anger, DO   1 g at 01/11/12 1478  . fentaNYL (SUBLIMAZE) injection 25 mcg  25 mcg Intravenous Q30 min PRN Laray Anger, DO      . fentaNYL (SUBLIMAZE) injection 25 mcg  25 mcg Intravenous Q30 min PRN Laray Anger, DO      . iohexol (OMNIPAQUE) 350 MG/ML injection 100 mL  100 mL Intravenous Once PRN Medication Radiologist, MD   100 mL at 01/11/12 0317  . ipratropium (ATROVENT) nebulizer solution 0.5 mg  0.5 mg Nebulization Once Laray Anger, DO   0.5 mg at 01/11/12 0020  . ipratropium (ATROVENT) nebulizer solution 0.5 mg  0.5 mg Nebulization Once Laray Anger, DO   0.5 mg at 01/11/12 0536  . levofloxacin (LEVAQUIN) IVPB 750 mg  750 mg Intravenous Q24H Laray Anger, DO   750 mg at 01/11/12 2956  . ondansetron (ZOFRAN) injection 4 mg  4 mg Intravenous Q1H PRN  Laray Anger, DO   4 mg at 01/11/12 0020  . sodium chloride 0.9 % bolus 500 mL  500 mL Intravenous Once Laray Anger, DO   500 mL at 01/11/12 0007  . vancomycin (VANCOCIN) IVPB 1000 mg/200 mL premix  1,000 mg Intravenous Q8H Laray Anger, DO       Medications Prior to Admission  Medication Sig Dispense Refill  . albuterol (PROVENTIL HFA;VENTOLIN HFA) 108 (90 BASE) MCG/ACT inhaler Inhale 2 puffs into the lungs as needed.       . Calcium Carbonate-Vitamin D (CALCIUM + D PO) Take 500 mg by mouth 2 (two) times daily.       . Fluticasone-Salmeterol (ADVAIR) 500-50 MCG/DOSE AEPB Inhale 1 puff into the lungs every 12 (twelve) hours.       . sertraline  (ZOLOFT) 100 MG tablet Take 100 mg by mouth at bedtime.       Marland Kitchen eletriptan (RELPAX) 40 MG tablet One tablet by mouth as needed for migraine headache.  If the headache improves and then returns, dose may be repeated after 2 hours have elapsed since first dose (do not exceed 80 mg per day). may repeat in 2 hours if necessary. HEADACHE       . EPINEPHrine (EPIPEN JR) 0.15 MG/0.3ML injection Inject 0.15 mg into the muscle as needed. Uses adult dose epi pen prn        Allergies: Aspirin; Shellfish-derived products; Hydrocodone; Norco; Oxycodone-acetaminophen; and Tegaderm ag mesh Past Medical History  Diagnosis Date  . Lymph node enlargement     left  . PONV (postoperative nausea and vomiting)     nausea  . Heart palpitations   . Headache     monthly with menses  . Asthma     daily and prn inhalers  . Depression   . Cancer 12/2011    breast   Past Surgical History  Procedure Date  . Cesarean section 1999 and 2001  . Cardiac catheterization     04-10-2011  . Breast biopsy 09/25/2011    Procedure: BREAST BIOPSY;  Surgeon: Almond Lint, MD;  Location: [facility];  Service: General;  Laterality: Bilateral;  Excision Bilateral Breast Masses  . Hernia repair age 49 or 7    IHR  . Knee surgery 2003    right patella   Family History  Problem Relation Age of Onset  . Adopted: Yes  . Cancer Maternal Aunt     breast, ovarian  . Cancer Maternal Grandmother     breast, lung, brain (died at 2)  . Cancer Cousin 20    ovarian  . Diabetes Mother     hypoglycemia   History   Social History  . Marital Status: Married    Spouse Name: N/A    Number of Children: N/A  . Years of Education: N/A   Occupational History  . Not on file.   Social History Main Topics  . Smoking status: Never Smoker   . Smokeless tobacco: Never Used  . Alcohol Use: No  . Drug Use: No  . Sexually Active: Yes   Other Topics Concern  . Not on file   Social History Narrative  . No narrative on file     Review of Systems: Pertinent items are noted in HPI.  Physical Exam: Blood pressure 105/54, pulse 99, temperature 97.5 F (36.4 C), temperature source Oral, resp. rate 16, height 5' 1.5" (1.562 m), weight 219 lb (99.338 kg), last menstrual period 12/13/2011, SpO2 99.00%. General: resting  in bed, ill appearing HEENT: PERRL, EOMI, no scleral icterus Cardiac: RRR, no rubs, murmurs or gallops Pulm: good air flow bilaterally, some scattered coarse sounds, do not fully clear with coughing Abd: soft, nontender, nondistended, BS present Ext: warm and well perfused, no pedal edema Neuro: alert and oriented X3, cranial nerves II-XII grossly intact   Lab results: Basic Metabolic Panel:  Basename 01/10/12 2324  NA 135  K 4.0  CL 100  CO2 24  GLUCOSE 95  BUN 7  CREATININE 0.67  CALCIUM 9.3  MG --  PHOS --   CBC:  Basename 01/10/12 2324  WBC 7.4  NEUTROABS 5.2  HGB 11.1*  HCT 34.3*  MCV 84.5  PLT 334   Urinalysis:  Basename 01/11/12 0007  COLORURINE YELLOW  LABSPEC 1.017  PHURINE 8.0  GLUCOSEU NEGATIVE  HGBUR NEGATIVE  BILIRUBINUR NEGATIVE  KETONESUR NEGATIVE  PROTEINUR NEGATIVE  UROBILINOGEN 0.2  NITRITE NEGATIVE  LEUKOCYTESUR NEGATIVE   Misc. Labs: Pregnancy test : -  Imaging results:  Ct Angio Chest W/cm &/or Wo Cm  01/11/2012  *RADIOLOGY REPORT*  Clinical Data: Short of breath.  Lightheaded.  Cough.  Low grade fever.  Bilateral mastectomy on 01/01/2012  CT ANGIOGRAPHY CHEST  Technique:  Multidetector CT imaging of the chest using the standard protocol during bolus administration of intravenous contrast. Multiplanar reconstructed images including MIPs were obtained and reviewed to evaluate the vascular anatomy.  Contrast: OMNIPAQUE IOHEXOL 350 MG/ML IV SOLN  Comparison: 04/18/2011  Findings: Suboptimal contrast bolus results in moderate opacification of the central and proximal segmental pulmonary arteries.  No focal filling defects are demonstrated,  suggesting no evidence of central pulmonary embolus.  Smaller peripheral emboli may be obscured.  Normal caliber thoracic aorta.  No abnormal mediastinal fluid collections.  No significant lymphadenopathy in the chest.  The esophagus is decompressed. Postoperative changes consistent with bilateral mastectomies.  Surgical drains in the subcutaneous soft tissues of the anterior chest bilaterally. No discrete fluid collections.  Scattered infiltration in the subcutaneous fat.  No pleural effusions.  Visualization of the lung fields is limited due to respiratory motion artifact but there appears to be consolidation or atelectasis in the medial aspect of the left upper lung.  No pneumothorax.  Visualized airways appear patent.  Degenerative changes in the lumbar spine.  IMPRESSION: No definite evidence of significant pulmonary embolus although suboptimal contrast bolus limits evaluation of distal vessels. Focal consolidation or atelectasis in the left upper lung medially. Postoperative changes in the anterior chest.  Original Report Authenticated By: Marlon Pel, M.D.   Dg Chest Port 1 View  01/11/2012  *RADIOLOGY REPORT*  Clinical Data: Dry cough, shortness of breath, and dizziness beginning yesterday.  Bilateral mastectomy 1 week ago for breast cancer.  Chest drains.  PORTABLE CHEST - 1 VIEW  Comparison: 05/09/2010  Findings: Shallow inspiration with elevation of the left hemidiaphragm.  Borderline heart size and pulmonary vascularity are likely normal for technique.  No focal airspace consolidation in the lungs.  No blunting of costophrenic angles.  No pneumothorax. Surgical drains bilaterally consistent with history of recent mastectomies.  IMPRESSION: Shallow inspiration with elevation of left hemidiaphragm.  No evidence of active pulmonary disease.  Surgical drains.  Original Report Authenticated By: Marlon Pel, M.D.   Assessment & Plan by Problem:  CAP with SIRS - Pt does not have recent  hospital stay that would qualify for HCAP. Will check urine legionella and strep. Could possibly be atalectasis versus CAP but will treat as  if CAP given new cough and SOB. Will also provide incentive spirometry and encourage pt to use. Will use azithromycin and ceftriaxone for CAP and oxygen as needed. Will also use advair and albuterol inhalers. Will use muccinex to break up congestion and robitussin for cough. Pt does meet SIRS criteria with her tachycardia and fever. Will use zofran for nausea and dilaudid for post-operative pain.   Asthma - No history of intubation, takes advair at home with albuterol inhaler as needed. Will continue using her advair/albuterol inhalers and oxygen as needed.   History of recent mastectomy - Done for recurrent breast masses non-cancerous and strong family history of breast cancer. No evidence of cancer in the removed tissue. Site does not appear infected and steri strips are intact over healing granulation tissue. Two drains are in place and some irritation around the site without signs of active cellulitis. Drains are draining small amount of yellow drainage with slight bloody discharge from right drain. Surgery did see the patient in the ED and do not believe her drains are infected and if she is still here on Monday can remove the drains in-pt if drainage meets criteria.   DVT ppx - Heparin [place] TID   Signed: Genella Mech 01/11/2012, 8:30 AM

## 2012-01-11 NOTE — ED Provider Notes (Signed)
History     CSN: [id number]  Arrival date & time 01/10/12  2210   First MD Initiated Contact with Patient 01/10/12 2303      Chief Complaint  Patient presents with  . Post-op Problem  . Fever    HPI Pt was seen at 2315.  Per pt, c/o gradual onset and persistence of constant fever that began over the course of the day today.  Pt states she is s/p bilat mastectomy on 01/01/12 (Dr. Donell Beers) and was seen in follow up by Surgeon 3 days ago for new erythema surrounding her JP sites x2.  Pt states she did not receive abx at that time.  Endorses today she developed home fever/chills, generalized weakness, lightheadedness, cough, increased drainage from both JP drains (drainage amount had been decreasing), as well as more erythema surrounding bilat JP sites and new erythema at her right lateral mastectomy surgical site.  Describes her JP drainage as "yellow-green" colored.  Pt took motrin 400mg  PTA to ED.  Denies CP/palpitations, no SOB, no abd pain, no N/V/D, no back pain.    Past Medical History  Diagnosis Date  . Lymph node enlargement     left  . PONV (postoperative nausea and vomiting)     nausea  . Heart palpitations   . Headache     monthly with menses  . Asthma     daily and prn inhalers  . Depression   . Cancer 12/2011    breast    Past Surgical History  Procedure Date  . Cesarean section 1999 and 2001  . Cardiac catheterization     04-10-2011  . Breast biopsy 09/25/2011    Procedure: BREAST BIOPSY;  Surgeon: Almond Lint, MD;  Location: [facility];  Service: General;  Laterality: Bilateral;  Excision Bilateral Breast Masses  . Hernia repair age 18 or 7    IHR  . Knee surgery 2003    right patella    Family History  Problem Relation Age of Onset  . Adopted: Yes  . Cancer Maternal Aunt     breast, ovarian  . Cancer Maternal Grandmother     breast, lung, brain (died at 91)  . Cancer Cousin 20    ovarian  . Diabetes Mother     hypoglycemia    History  Substance Use  Topics  . Smoking status: Never Smoker   . Smokeless tobacco: Never Used  . Alcohol Use: No   Review of Systems ROS: Statement: All systems negative except as marked or noted in the HPI; Constitutional: +fever and chills. ; ; Eyes: Negative for eye pain, redness and discharge. ; ; ENMT: Negative for ear pain, hoarseness, nasal congestion, sinus pressure and sore throat. ; ; Cardiovascular: Negative for chest pain, palpitations, diaphoresis, dyspnea and peripheral edema. ; ; Respiratory: +cough.  Negative for wheezing and stridor. ; ; Gastrointestinal: Negative for nausea, vomiting, diarrhea, abdominal pain, blood in stool, hematemesis, jaundice and rectal bleeding. . ; ; Genitourinary: Negative for dysuria, flank pain and hematuria. ; ; Musculoskeletal: Negative for back pain and neck pain. Negative for swelling and trauma.; ; Skin: +rash.  Negative for pruritus, abrasions, blisters, bruising and skin lesion.; ; Neuro: +generalized weakness, lightheadedness.  Negative for headache and neck stiffness. Negative for altered level of consciousness , altered mental status, extremity weakness, paresthesias, involuntary movement, seizure and syncope.     Allergies  Aspirin; Shellfish-derived products; Hydrocodone; Norco; Oxycodone-acetaminophen; and Tegaderm ag mesh  Home Medications   Current Outpatient Rx  Name  Route Sig Dispense Refill  . ALBUTEROL SULFATE HFA 108 (90 BASE) MCG/ACT IN AERS Inhalation Inhale 2 puffs into the lungs as needed.     Marland Kitchen CALCIUM + D PO Oral Take 500 mg by mouth 2 (two) times daily.     Marland Kitchen FLUTICASONE-SALMETEROL 500-50 MCG/DOSE IN AEPB Inhalation Inhale 1 puff into the lungs every 12 (twelve) hours.     Marland Kitchen HYDROMORPHONE HCL 2 MG PO TABS Oral Take 2 mg by mouth every 4 (four) hours as needed. For pain    . IBUPROFEN 200 MG PO TABS Oral Take 400 mg by mouth every 6 (six) hours as needed. For fever    . SERTRALINE HCL 100 MG PO TABS Oral Take 100 mg by mouth at bedtime.     Marland Kitchen  ELETRIPTAN HYDROBROMIDE 40 MG PO TABS Oral One tablet by mouth as needed for migraine headache.  If the headache improves and then returns, dose may be repeated after 2 hours have elapsed since first dose (do not exceed 80 mg per day). may repeat in 2 hours if necessary. HEADACHE     . EPINEPHRINE 0.15 MG/0.3ML IJ DEVI Intramuscular Inject 0.15 mg into the muscle as needed. Uses adult dose epi pen prn      BP 114/43  Pulse 138  Temp(Src) 99.5 F (37.5 C) (Oral)  Resp 17  Wt 219 lb (99.338 kg)  SpO2 100%  LMP 12/13/2011  Physical Exam 2320: Physical examination:  Nursing notes reviewed; Vital signs and O2 SAT reviewed;  Constitutional: Well developed, Well nourished, Well hydrated, In no acute distress, +febrile; Head:  Normocephalic, atraumatic; Eyes: EOMI, PERRL, No scleral icterus; ENMT: Mouth and pharynx normal, Mucous membranes moist; Neck: Supple, Full range of motion, No lymphadenopathy; Cardiovascular: +tachycardic rate and rhythm, No murmur or gallop; Respiratory: Breath sounds clear & equal bilaterally, No rales, rhonchi, wheezes, Normal respiratory effort/excursion, +non-prod cough during exam, speaking full sentences with ease.; Chest: +bilat mastectomy sites with DSD intact, no drainage from wounds, +right lateral mastectomy wound with surrounding erythema and TTP, no palp fluctuance or soft tissue crepitus, +bilat JP drain sites with surrounding erythema and TTP, +bilat JP drains with yellow drainage, Movement normal; Abdomen: Soft, Nontender, Nondistended, Normal bowel sounds;  Extremities: Pulses normal, No tenderness, No edema, No calf edema or asymmetry.; Neuro: AA&Ox3, Major CN grossly intact.  No gross focal motor or sensory deficits in extremities.; Skin: Color normal, Warm, Dry   ED Course  Procedures   MDM  MDM Reviewed: nursing note, vitals and previous chart Interpretation: labs and x-ray   Results for orders placed during the hospital encounter of 01/10/12  BASIC  METABOLIC PANEL      Component Value Range   Sodium 135  135 - 145 (mEq/L)   Potassium 4.0  3.5 - 5.1 (mEq/L)   Chloride 100  96 - 112 (mEq/L)   CO2 24  19 - 32 (mEq/L)   Glucose, Bld 95  70 - 99 (mg/dL)   BUN 7  6 - 23 (mg/dL)   Creatinine, Ser 1.61  0.50 - 1.10 (mg/dL)   Calcium 9.3  8.4 - 09.6 (mg/dL)   GFR calc non Af Amer >90  >90 (mL/min)   GFR calc Af Amer >90  >90 (mL/min)  CBC      Component Value Range   WBC 7.4  4.0 - 10.5 (K/uL)   RBC 4.06  3.87 - 5.11 (MIL/uL)   Hemoglobin 11.1 (*) 12.0 - 15.0 (g/dL)   HCT 04.5 (*)  36.0 - 46.0 (%)   MCV 84.5  78.0 - 100.0 (fL)   MCH 27.3  26.0 - 34.0 (pg)   MCHC 32.4  30.0 - 36.0 (g/dL)   RDW 16.1 (*) 09.6 - 15.5 (%)   Platelets 334  150 - 400 (K/uL)  DIFFERENTIAL      Component Value Range   Neutrophils Relative 70  43 - 77 (%)   Neutro Abs 5.2  1.7 - 7.7 (K/uL)   Lymphocytes Relative 19  12 - 46 (%)   Lymphs Abs 1.4  0.7 - 4.0 (K/uL)   Monocytes Relative 9  3 - 12 (%)   Monocytes Absolute 0.7  0.1 - 1.0 (K/uL)   Eosinophils Relative 2  0 - 5 (%)   Eosinophils Absolute 0.1  0.0 - 0.7 (K/uL)   Basophils Relative 0  0 - 1 (%)   Basophils Absolute 0.0  0.0 - 0.1 (K/uL)  URINALYSIS, ROUTINE W REFLEX MICROSCOPIC      Component Value Range   Color, Urine YELLOW  YELLOW    APPearance CLEAR  CLEAR    Specific Gravity, Urine 1.017  1.005 - 1.030    pH 8.0  5.0 - 8.0    Glucose, UA NEGATIVE  NEGATIVE (mg/dL)   Hgb urine dipstick NEGATIVE  NEGATIVE    Bilirubin Urine NEGATIVE  NEGATIVE    Ketones, ur NEGATIVE  NEGATIVE (mg/dL)   Protein, ur NEGATIVE  NEGATIVE (mg/dL)   Urobilinogen, UA 0.2  0.0 - 1.0 (mg/dL)   Nitrite NEGATIVE  NEGATIVE    Leukocytes, UA NEGATIVE  NEGATIVE   PROCALCITONIN      Component Value Range   Procalcitonin <0.10    LACTIC ACID, PLASMA      Component Value Range   Lactic Acid, Venous 1.1  0.5 - 2.2 (mmol/L)  POCT PREGNANCY, URINE      Component Value Range   Preg Test, Ur NEGATIVE  NEGATIVE    Dg  Chest Port 1 View 01/11/2012  *RADIOLOGY REPORT*  Clinical Data: Dry cough, shortness of breath, and dizziness beginning yesterday.  Bilateral mastectomy 1 week ago for breast cancer.  Chest drains.  PORTABLE CHEST - 1 VIEW  Comparison: 05/09/2010  Findings: Shallow inspiration with elevation of the left hemidiaphragm.  Borderline heart size and pulmonary vascularity are likely normal for technique.  No focal airspace consolidation in the lungs.  No blunting of costophrenic angles.  No pneumothorax. Surgical drains bilaterally consistent with history of recent mastectomies.  IMPRESSION: Shallow inspiration with elevation of left hemidiaphragm.  No evidence of active pulmonary disease.  Surgical drains.  Original Report Authenticated By: Marlon Pel, M.D.      [phone]:  Chest wall at JP sites x2, as well as right lateral breast surgical wound, appear cellulitic.  Pt's fever improved after tylenol, though remains tachycardic.  Pt requesting a neb tx for her cough and "what feels like my asthma acting up."  CXR and UA without infection; will obtain CT-A chest to r/o PE. T/C to General Surgeon Dr. Lindie Spruce, case discussed, including:  HPI, pertinent PM/SHx, VS/PE, dx testing, ED course and treatment:  Agreeable to come to ED for eval, aware CT-A chest r/o PE is pending.     Ct Angio Chest W/cm &/or Wo Cm 01/11/2012  *RADIOLOGY REPORT*  Clinical Data: Short of breath.  Lightheaded.  Cough.  Low grade fever.  Bilateral mastectomy on 01/01/2012  CT ANGIOGRAPHY CHEST  Technique:  Multidetector CT imaging of the chest using  the standard protocol during bolus administration of intravenous contrast. Multiplanar reconstructed images including MIPs were obtained and reviewed to evaluate the vascular anatomy.  Contrast: OMNIPAQUE IOHEXOL 350 MG/ML IV SOLN  Comparison: 04/18/2011  Findings: Suboptimal contrast bolus results in moderate opacification of the central and proximal segmental pulmonary arteries.  No focal  filling defects are demonstrated, suggesting no evidence of central pulmonary embolus.  Smaller peripheral emboli may be obscured.  Normal caliber thoracic aorta.  No abnormal mediastinal fluid collections.  No significant lymphadenopathy in the chest.  The esophagus is decompressed. Postoperative changes consistent with bilateral mastectomies.  Surgical drains in the subcutaneous soft tissues of the anterior chest bilaterally. No discrete fluid collections.  Scattered infiltration in the subcutaneous fat.  No pleural effusions.  Visualization of the lung fields is limited due to respiratory motion artifact but there appears to be consolidation or atelectasis in the medial aspect of the left upper lung.  No pneumothorax.  Visualized airways appear patent.  Degenerative changes in the lumbar spine.  IMPRESSION: No definite evidence of significant pulmonary embolus although suboptimal contrast bolus limits evaluation of distal vessels. Focal consolidation or atelectasis in the left upper lung medially. Postoperative changes in the anterior chest.  Original Report Authenticated By: Marlon Pel, M.D.    0630:  IV abx started for cellulitis and HCAP.  Pt felt improved after 1st neb and is requesting a 2nd, will dose.  Dx testing d/w pt.  Questions answered.  Verb understanding, agreeable to admit.  T/C to General Surgery Dr. Lindie Spruce, case discussed, including:  HPI, pertinent PM/SHx, VS/PE, dx testing, ED course and treatment:  Agreeable to come to ED for eval.    0700:  General Surgeon Dr. Lindie Spruce has been in to eval pt, requests to admit to medicine service.    7:20 AM:   Pt states her PMD is in [place].  T/C to [facility], case discussed, including:  HPI, pertinent PM/SHx, VS/PE, dx testing, ED course and treatment:  Agreeable to admit, will come to ED for eval.      Laray Anger, DO 01/13/12 [phone]

## 2012-01-11 NOTE — ED Notes (Signed)
Pharmacy notified for antibiotics

## 2012-01-11 NOTE — Progress Notes (Signed)
ANTIBIOTIC CONSULT NOTE - INITIAL  Pharmacy Consult for Vancomycin, Maxipime & Levaquin Indication: suspected pneumonia  Allergies  Allergen Reactions  . Aspirin Anaphylaxis  . Shellfish-Derived Products Shortness Of Breath and Rash    WHEEZING  . Hydrocodone Other (See Comments)    Blisters on tongue and around lips. Applies to any hydrocodone medication.  Awanda Mink (Hydrocodone-Acetaminophen) Other (See Comments)    Blisters on tongue and around lips. Per patient she has this reaction with any hydrocodone based medications.  . Oxycodone-Acetaminophen Anxiety and Other (See Comments)    Made patient skin crawl "inside and out". Also kept her awake all night.  . Tegaderm Ag Mesh (Silver) Other (See Comments)    Blisters     Patient Measurements: Height: 5' 1.5" (156.2 cm) Weight: 219 lb (99.338 kg) IBW/kg (Calculated) : 48.95   Vital Signs: Temp: 98.9 F (37.2 C) (03/30 0456) Temp src: Oral (03/30 0456) BP: 109/48 mmHg (03/30 0056) Pulse Rate: 129  (03/30 0056)  Labs:  Basename 01/10/12 2324  WBC 7.4  HGB 11.1*  PLT 334  LABCREA --  CREATININE 0.67   Estimated Creatinine Clearance: 109.1 ml/min (by C-G formula based on Cr of 0.67).   Medical History: Past Medical History  Diagnosis Date  . Lymph node enlargement     left  . PONV (postoperative nausea and vomiting)     nausea  . Heart palpitations   . Headache     monthly with menses  . Asthma     daily and prn inhalers  . Depression   . Cancer 12/2011    breast   Assessment:   Noted history of double mastectomy March 20th. Noted fever/chills, erythema around tube sites, new yellow-green drainage. Urine culture sent.  Admit note pending.  Goal of Therapy:  Vancomycin trough level 15-20 mcg/ml Appropriate doses for renal function and infection  Plan:    Maxipime to begin and continue x 8 days.   Regimen adjusted from 1 gram q8hrs to 1 gram q12hrs.   Will continue Levaquin 750 mg IV q24hrs - ordered  for 3 days.   Will begin Vancomycin 1 gram IV q8 hrs.  Dennie Fetters, RPh Pager: [phone] 01/11/2012,5:34 AM

## 2012-01-11 NOTE — ED Notes (Signed)
Dr Wyatt at bedside.  

## 2012-01-11 NOTE — Consult Note (Signed)
Reason for Consult:fever, postop Referring Physician: Dr. Kai Levins is an 36 y.o. female.  HPI: 36 yo wf about 1 1/2 weeks out from bilateral mastectomy presents with fever and cough that started yesterday.   Past Medical History  Diagnosis Date  . Lymph node enlargement     left  . PONV (postoperative nausea and vomiting)     nausea  . Heart palpitations   . Headache     monthly with menses  . Asthma     daily and prn inhalers  . Depression   . Cancer 12/2011    breast    Past Surgical History  Procedure Date  . Cesarean section 1999 and 2001  . Cardiac catheterization     04-10-2011  . Breast biopsy 09/25/2011    Procedure: BREAST BIOPSY;  Surgeon: Almond Lint, MD;  Location: [facility];  Service: General;  Laterality: Bilateral;  Excision Bilateral Breast Masses  . Hernia repair age 29 or 7    IHR  . Knee surgery 2003    right patella    Family History  Problem Relation Age of Onset  . Adopted: Yes  . Cancer Maternal Aunt     breast, ovarian  . Cancer Maternal Grandmother     breast, lung, brain (died at 89)  . Cancer Cousin 20    ovarian  . Diabetes Mother     hypoglycemia    Social History:  reports that she has never smoked. She has never used smokeless tobacco. She reports that she does not drink alcohol or use illicit drugs.  Allergies:  Allergies  Allergen Reactions  . Aspirin Anaphylaxis  . Shellfish-Derived Products Shortness Of Breath and Rash    WHEEZING  . Hydrocodone Other (See Comments)    Blisters on tongue and around lips. Applies to any hydrocodone medication.  Awanda Mink (Hydrocodone-Acetaminophen) Other (See Comments)    Blisters on tongue and around lips. Per patient she has this reaction with any hydrocodone based medications.  . Oxycodone-Acetaminophen Anxiety and Other (See Comments)    Made patient skin crawl "inside and out". Also kept her awake all night.  . Tegaderm Ag Mesh (Silver) Other (See Comments)    Blisters       Medications: I have reviewed the patient's current medications.  Results for orders placed during the hospital encounter of 01/10/12 (from the past 48 hour(s))  BASIC METABOLIC PANEL     Status: Normal   Collection Time   01/10/12 11:24 PM      Component Value Range Comment   Sodium 135  135 - 145 (mEq/L)    Potassium 4.0  3.5 - 5.1 (mEq/L)    Chloride 100  96 - 112 (mEq/L)    CO2 24  19 - 32 (mEq/L)    Glucose, Bld 95  70 - 99 (mg/dL)    BUN 7  6 - 23 (mg/dL)    Creatinine, Ser 1.91  0.50 - 1.10 (mg/dL)    Calcium 9.3  8.4 - 10.5 (mg/dL)    GFR calc non Af Amer >90  >90 (mL/min)    GFR calc Af Amer >90  >90 (mL/min)   CBC     Status: Abnormal   Collection Time   01/10/12 11:24 PM      Component Value Range Comment   WBC 7.4  4.0 - 10.5 (K/uL)    RBC 4.06  3.87 - 5.11 (MIL/uL)    Hemoglobin 11.1 (*) 12.0 - 15.0 (g/dL)  HCT 34.3 (*) 36.0 - 46.0 (%)    MCV 84.5  78.0 - 100.0 (fL)    MCH 27.3  26.0 - 34.0 (pg)    MCHC 32.4  30.0 - 36.0 (g/dL)    RDW 45.4 (*) 09.8 - 15.5 (%)    Platelets 334  150 - 400 (K/uL)   DIFFERENTIAL     Status: Normal   Collection Time   01/10/12 11:24 PM      Component Value Range Comment   Neutrophils Relative 70  43 - 77 (%)    Neutro Abs 5.2  1.7 - 7.7 (K/uL)    Lymphocytes Relative 19  12 - 46 (%)    Lymphs Abs 1.4  0.7 - 4.0 (K/uL)    Monocytes Relative 9  3 - 12 (%)    Monocytes Absolute 0.7  0.1 - 1.0 (K/uL)    Eosinophils Relative 2  0 - 5 (%)    Eosinophils Absolute 0.1  0.0 - 0.7 (K/uL)    Basophils Relative 0  0 - 1 (%)    Basophils Absolute 0.0  0.0 - 0.1 (K/uL)   PROCALCITONIN     Status: Normal   Collection Time   01/10/12 11:24 PM      Component Value Range Comment   Procalcitonin <0.10     LACTIC ACID, PLASMA     Status: Normal   Collection Time   01/10/12 11:24 PM      Component Value Range Comment   Lactic Acid, Venous 1.1  0.5 - 2.2 (mmol/L)   URINALYSIS, ROUTINE W REFLEX MICROSCOPIC     Status: Normal   Collection  Time   01/11/12 12:07 AM      Component Value Range Comment   Color, Urine YELLOW  YELLOW     APPearance CLEAR  CLEAR     Specific Gravity, Urine 1.017  1.005 - 1.030     pH 8.0  5.0 - 8.0     Glucose, UA NEGATIVE  NEGATIVE (mg/dL)    Hgb urine dipstick NEGATIVE  NEGATIVE     Bilirubin Urine NEGATIVE  NEGATIVE     Ketones, ur NEGATIVE  NEGATIVE (mg/dL)    Protein, ur NEGATIVE  NEGATIVE (mg/dL)    Urobilinogen, UA 0.2  0.0 - 1.0 (mg/dL)    Nitrite NEGATIVE  NEGATIVE     Leukocytes, UA NEGATIVE  NEGATIVE  MICROSCOPIC NOT DONE ON URINES WITH NEGATIVE PROTEIN, BLOOD, LEUKOCYTES, NITRITE, OR GLUCOSE <1000 mg/dL.  POCT PREGNANCY, URINE     Status: Normal   Collection Time   01/11/12 12:21 AM      Component Value Range Comment   Preg Test, Ur NEGATIVE  NEGATIVE      Ct Angio Chest W/cm &/or Wo Cm  01/11/2012  *RADIOLOGY REPORT*  Clinical Data: Short of breath.  Lightheaded.  Cough.  Low grade fever.  Bilateral mastectomy on 01/01/2012  CT ANGIOGRAPHY CHEST  Technique:  Multidetector CT imaging of the chest using the standard protocol during bolus administration of intravenous contrast. Multiplanar reconstructed images including MIPs were obtained and reviewed to evaluate the vascular anatomy.  Contrast: OMNIPAQUE IOHEXOL 350 MG/ML IV SOLN  Comparison: 04/18/2011  Findings: Suboptimal contrast bolus results in moderate opacification of the central and proximal segmental pulmonary arteries.  No focal filling defects are demonstrated, suggesting no evidence of central pulmonary embolus.  Smaller peripheral emboli may be obscured.  Normal caliber thoracic aorta.  No abnormal mediastinal fluid collections.  No significant lymphadenopathy in  the chest.  The esophagus is decompressed. Postoperative changes consistent with bilateral mastectomies.  Surgical drains in the subcutaneous soft tissues of the anterior chest bilaterally. No discrete fluid collections.  Scattered infiltration in the subcutaneous  fat.  No pleural effusions.  Visualization of the lung fields is limited due to respiratory motion artifact but there appears to be consolidation or atelectasis in the medial aspect of the left upper lung.  No pneumothorax.  Visualized airways appear patent.  Degenerative changes in the lumbar spine.  IMPRESSION: No definite evidence of significant pulmonary embolus although suboptimal contrast bolus limits evaluation of distal vessels. Focal consolidation or atelectasis in the left upper lung medially. Postoperative changes in the anterior chest.  Original Report Authenticated By: Marlon Pel, M.D.   Dg Chest Port 1 View  01/11/2012  *RADIOLOGY REPORT*  Clinical Data: Dry cough, shortness of breath, and dizziness beginning yesterday.  Bilateral mastectomy 1 week ago for breast cancer.  Chest drains.  PORTABLE CHEST - 1 VIEW  Comparison: 05/09/2010  Findings: Shallow inspiration with elevation of the left hemidiaphragm.  Borderline heart size and pulmonary vascularity are likely normal for technique.  No focal airspace consolidation in the lungs.  No blunting of costophrenic angles.  No pneumothorax. Surgical drains bilaterally consistent with history of recent mastectomies.  IMPRESSION: Shallow inspiration with elevation of left hemidiaphragm.  No evidence of active pulmonary disease.  Surgical drains.  Original Report Authenticated By: Marlon Pel, M.D.    Review of Systems  Constitutional: Positive for fever and chills.  HENT: Negative.   Eyes: Negative.   Respiratory: Positive for cough and shortness of breath.   Cardiovascular: Negative.   Gastrointestinal: Negative.   Genitourinary: Negative.   Musculoskeletal: Negative.   Skin: Negative.   Neurological: Negative.   Endo/Heme/Allergies: Negative.   Psychiatric/Behavioral: Negative.    Blood pressure 107/51, pulse 129, temperature 98.9 F (37.2 C), temperature source Oral, resp. rate 15, height 5' 1.5" (1.562 m), weight 219 lb  (99.338 kg), last menstrual period 12/13/2011, SpO2 96.00%. Physical Exam  Constitutional: She is oriented to person, place, and time. She appears well-developed and well-nourished.  HENT:  Head: Normocephalic and atraumatic.  Eyes: Conjunctivae and EOM are normal. Pupils are equal, round, and reactive to light.  Neck: Normal range of motion. Neck supple.  Cardiovascular: Normal rate, regular rhythm and normal heart sounds.        tachycardic  Respiratory: She exhibits tenderness.       Poorly controlled cough. Mastectomy incisions look good. No cellulitis. Drain output is serous  GI: Soft. Bowel sounds are normal.  Musculoskeletal: Normal range of motion.  Neurological: She is alert and oriented to person, place, and time.  Skin: Skin is warm and dry.  Psychiatric: She has a normal mood and affect. Her behavior is normal.    Assessment/Plan: Probable pneumonia seen on CT. Mastectomy sites look good. She will be admitted to medicine to treat her pneumonia and we will consult.  TOTH III,Keith Cancio S 01/11/2012, 8:01 AM

## 2012-01-12 LAB — COMPREHENSIVE METABOLIC PANEL
ALT: 5 U/L (ref 0–35)
AST: 12 U/L (ref 0–37)
Calcium: 8.7 mg/dL (ref 8.4–10.5)
Creatinine, Ser: 0.71 mg/dL (ref 0.50–1.10)
GFR calc Af Amer: >90 mL/min (ref >90)
Sodium: 138 meq/L (ref 135–145)
Total Protein: 6.5 g/dL (ref 6.0–8.3)

## 2012-01-12 LAB — CBC
HCT: 33 % — ABNORMAL LOW (ref 36.0–46.0)
Hemoglobin: 10.7 g/dL — ABNORMAL LOW (ref 12.0–15.0)
MCH: 27.7 pg (ref 26.0–34.0)
MCHC: 32.4 g/dL (ref 30.0–36.0)
RBC: 3.86 MIL/uL — ABNORMAL LOW (ref 3.87–5.11)

## 2012-01-12 LAB — URINE CULTURE
Colony Count: 75000
Culture  Setup Time: 201303300109

## 2012-01-12 LAB — LEGIONELLA ANTIGEN, URINE

## 2012-01-12 NOTE — Progress Notes (Signed)
Subjective: Pt is not feeling that well today. She is very nauseous but has not vomited. She has been reluctant to take medications because they affect her very strongly and she wants to be awake and alert for the doctors. Encouraged her to take the day and take care of herself and let us take care of her. She does have several small children and teenagers at home. Some pain at the mastectomy site with coughing. Her breathing is done better this morning. Still coughing some but less spells and starting to bring up some clear phlegm when she coughs. Did have fever this morning. She is also having a headache from starting her period last night. She reports that she always has this headache with it and usually take tylenol and that helps although it occasionally turns into a migraine then she needs to take some elatriptan for it. Encouraged her to take it if she needed it today.   Objective: Vital signs in last 24 hours: Filed Vitals:   01/11/12 2052 01/12/12 0135 01/12/12 0556 01/12/12 0852  BP: 116/81  129/79   Pulse: 108  110   Temp: 99.9 F (37.7 C) 99.2 F (37.3 C) 100.7 F (38.2 C)   TempSrc: Oral Oral Oral   Resp: 18  18   Height:      Weight:      SpO2: 95%  91% 95%   Weight change:   Intake/Output Summary (Last 24 hours) at 01/12/12 1017 Last data filed at 01/11/12 1800  Gross per 24 hour  Intake    660 ml  Output      3 ml  Net    657 ml   Physical Exam: General: resting in bed, ill appearing  HEENT: PERRL, EOMI, no scleral icterus  Cardiac: RRR, no rubs, murmurs or gallops  Pulm: good air flow bilaterally, some scattered coarse sounds, do not fully clear with coughing  Abd: soft, nontender, nondistended, BS present  Ext: warm and well perfused, no pedal edema  Neuro: alert and oriented X3, cranial nerves II-XII grossly intact  Lab Results: Basic Metabolic Panel:  Lab 01/12/12 4098 01/10/12 2324  NA 138 135  K 4.1 4.0  CL 105 100  CO2 25 24  GLUCOSE 113* 95  BUN 7  7  CREATININE 0.71 0.67  CALCIUM 8.7 9.3  MG -- --  PHOS -- --   Liver Function Tests:  Lab 01/12/12 0700  AST 12  ALT 5  ALKPHOS 55  BILITOT 0.2*  PROT 6.5  ALBUMIN 3.2*   CBC:  Lab 01/12/12 0700 01/10/12 2324  WBC 7.4 7.4  NEUTROABS -- 5.2  HGB 10.7* 11.1*  HCT 33.0* 34.3*  MCV 85.5 84.5  PLT 318 334   Urinalysis:  Lab 01/11/12 0007  COLORURINE YELLOW  LABSPEC 1.017  PHURINE 8.0  GLUCOSEU NEGATIVE  HGBUR NEGATIVE  BILIRUBINUR NEGATIVE  KETONESUR NEGATIVE  PROTEINUR NEGATIVE  UROBILINOGEN 0.2  NITRITE NEGATIVE  LEUKOCYTESUR NEGATIVE   Micro Results: Recent Results (from the past 240 hour(s))  CULTURE, SPUTUM-ASSESSMENT     Status: Normal   Collection Time   01/11/12  8:42 PM      Component Value Range Status Comment   Specimen Description SPUTUM   Final    Special Requests NONE   Final    Sputum evaluation     Final    Value: THIS SPECIMEN IS ACCEPTABLE. RESPIRATORY CULTURE REPORT TO FOLLOW.   Report Status 01/11/2012 FINAL   Final    Studies/Results:  Ct Angio Chest W/cm &/or Wo Cm  01/11/2012  *RADIOLOGY REPORT*  Clinical Data: Short of breath.  Lightheaded.  Cough.  Low grade fever.  Bilateral mastectomy on 01/01/2012  CT ANGIOGRAPHY CHEST  Technique:  Multidetector CT imaging of the chest using the standard protocol during bolus administration of intravenous contrast. Multiplanar reconstructed images including MIPs were obtained and reviewed to evaluate the vascular anatomy.  Contrast: OMNIPAQUE IOHEXOL 350 MG/ML IV SOLN  Comparison: 04/18/2011  Findings: Suboptimal contrast bolus results in moderate opacification of the central and proximal segmental pulmonary arteries.  No focal filling defects are demonstrated, suggesting no evidence of central pulmonary embolus.  Smaller peripheral emboli may be obscured.  Normal caliber thoracic aorta.  No abnormal mediastinal fluid collections.  No significant lymphadenopathy in the chest.  The esophagus is  decompressed. Postoperative changes consistent with bilateral mastectomies.  Surgical drains in the subcutaneous soft tissues of the anterior chest bilaterally. No discrete fluid collections.  Scattered infiltration in the subcutaneous fat.  No pleural effusions.  Visualization of the lung fields is limited due to respiratory motion artifact but there appears to be consolidation or atelectasis in the medial aspect of the left upper lung.  No pneumothorax.  Visualized airways appear patent.  Degenerative changes in the lumbar spine.  IMPRESSION: No definite evidence of significant pulmonary embolus although suboptimal contrast bolus limits evaluation of distal vessels. Focal consolidation or atelectasis in the left upper lung medially. Postoperative changes in the anterior chest.  Original Report Authenticated By: Marlon Pel, M.D.   Dg Chest Port 1 View  01/11/2012  *RADIOLOGY REPORT*  Clinical Data: Dry cough, shortness of breath, and dizziness beginning yesterday.  Bilateral mastectomy 1 week ago for breast cancer.  Chest drains.  PORTABLE CHEST - 1 VIEW  Comparison: 05/09/2010  Findings: Shallow inspiration with elevation of the left hemidiaphragm.  Borderline heart size and pulmonary vascularity are likely normal for technique.  No focal airspace consolidation in the lungs.  No blunting of costophrenic angles.  No pneumothorax. Surgical drains bilaterally consistent with history of recent mastectomies.  IMPRESSION: Shallow inspiration with elevation of left hemidiaphragm.  No evidence of active pulmonary disease.  Surgical drains.  Original Report Authenticated By: Marlon Pel, M.D.   Medications: I have reviewed the patient's current medications. Scheduled Meds:   . azithromycin  500 mg Intravenous Q24H  . cefTRIAXone (ROCEPHIN)  IV  1 g Intravenous Q24H  . Fluticasone-Salmeterol  1 puff Inhalation BID  . heparin  5,000 Units Subcutaneous Q8H  . sertraline  100 mg Oral QHS  .  DISCONTD: ceFEPime (MAXIPIME) IV  1 g Intravenous Q12H  . DISCONTD: levofloxacin (LEVAQUIN) IV  750 mg Intravenous Q24H  . DISCONTD: vancomycin  1,000 mg Intravenous Q8H   Continuous Infusions:   . sodium chloride 100 mL/hr at 01/11/12 1703  . DISCONTD: sodium chloride 125 mL/hr at 01/11/12 0007   PRN Meds:.acetaminophen, acetaminophen, albuterol, alum & mag hydroxide-simeth, eletriptan, EPINEPHrine, fentaNYL, guaiFENesin, HYDROmorphone, ondansetron (ZOFRAN) IV, ondansetron, DISCONTD: guaifenesin, DISCONTD: guaiFENesin-codeine, DISCONTD: ondansetron Assessment/Plan:  CAP with SIRS - Pt does not have recent hospital stay that would qualify for HCAP. Urine legionella and strep pending and sputum culture pending. Have incentive spirometry and encourage pt to use. Will use azithromycin and ceftriaxone for CAP and oxygen as needed. Will also use advair and albuterol inhalers. Will use muccinex to break up congestion and robitussin for cough. Pt does meet SIRS criteria with her tachycardia and fever. Will use zofran for  nausea and dilaudid for post-operative pain.   Asthma - No history of intubation, takes advair at home with albuterol inhaler as needed. Will continue using her advair/albuterol inhalers and oxygen as needed.   History of recent mastectomy - Done for recurrent breast masses non-cancerous and strong family history of breast cancer. No evidence of cancer in the removed tissue. Site does not appear infected and steri strips are intact over healing granulation tissue. Two drains are in place and some irritation around the site without signs of active cellulitis. Drains are draining small amount of yellow drainage with slight bloody discharge from right drain. Surgery did see the patient in the ED and do not believe her drains are infected and if she is still here on Monday can remove the drains in-pt if drainage meets criteria.   DVT ppx - Heparin [place] TID  Disposition - Likely home tomorrow  if she is feeling better. Will continue IV antibiotics today with the intent to switch to PO tomorrow.    LOS: 2 days   Genella Mech 01/12/2012, 10:17 AM

## 2012-01-12 NOTE — H&P (Signed)
Internal Medicine Attending Admission Note Date: 01/12/2012  Patient name: Tara Clarke Medical record number: [id number] Date of birth: [birth year] Age: 36 y.o. Gender: female  I saw and evaluated the patient. I reviewed the resident's note and I agree with the resident's findings and plan as documented in the resident's note with the exception of the documentation in the [facility] section that the patient has breast cancer.  She does not have a history of breast cancer.  Chief Complaint(s): Cough, shortness of breath, and fevers.  History - key components related to admission:  Tara Clarke is a 36 year old woman recently status post bilateral mastectomies for non cancerous breast masses and asthma who presents with a two-day history of cough, shortness of breath, and fevers. She has done well postoperatively with regards to wound healing. She has managed her pain with Tylenol and ibuprofen. She has 5 young children at home but no definitive sick contacts. Her asthma is well controlled on a controller medication and the very rare need to use albuterol. Since admission she has developed nausea and vomiting. She denies chest pain other than incisional, or abdominal pain. She's had no diarrhea.  Physical Exam - key components related to admission:  Filed Vitals:   01/11/12 2052 01/12/12 0135 01/12/12 0556 01/12/12 0852  BP: 116/81  129/79   Pulse: 108  110   Temp: 99.9 F (37.7 C) 99.2 F (37.3 C) 100.7 F (38.2 C)   TempSrc: Oral Oral Oral   Resp: 18  18   Height:      Weight:      SpO2: 95%  91% 95%   Gen.: Well-developed, well-nourished, woman lying in bed in mild distress from the nausea. When she coughs she holds a pillow up against her chest. Lungs: Clear to auscultation bilaterally without wheezes, rhonchi or rales. Bronchial breath sounds are difficult to determine so close to her trachea on the left side over the area of the pneumonia seen on CT scan. Heart: Regular rate and rhythm  without murmurs, rubs, or gallops. Abdomen: Soft, non tender, hypoactive bowel sounds without masses. Extremities: Without edema.  Lab results:  Basic Metabolic Panel:  Basename 01/12/12 0700 01/10/12 2324  NA 138 135  K 4.1 4.0  CL 105 100  CO2 25 24  GLUCOSE 113* 95  BUN 7 7  CREATININE 0.71 0.67  CALCIUM 8.7 9.3  MG -- --  PHOS -- --   Liver Function Tests:  Basename 01/12/12 0700  AST 12  ALT 5  ALKPHOS 55  BILITOT 0.2*  PROT 6.5  ALBUMIN 3.2*   CBC:  Basename 01/12/12 0700 01/10/12 2324  WBC 7.4 7.4  NEUTROABS -- 5.2  HGB 10.7* 11.1*  HCT 33.0* 34.3*  MCV 85.5 84.5  PLT 318 334   Urinalysis:  Unremarkable  Misc. Labs:  Pregnancy test negative  Imaging results:  Ct Angio Chest W/cm &/or Wo Cm  01/11/2012  *RADIOLOGY REPORT*  Clinical Data: Short of breath.  Lightheaded.  Cough.  Low grade fever.  Bilateral mastectomy on 01/01/2012  CT ANGIOGRAPHY CHEST  Technique:  Multidetector CT imaging of the chest using the standard protocol during bolus administration of intravenous contrast. Multiplanar reconstructed images including MIPs were obtained and reviewed to evaluate the vascular anatomy.  Contrast: OMNIPAQUE IOHEXOL 350 MG/ML IV SOLN  Comparison: 04/18/2011  Findings: Suboptimal contrast bolus results in moderate opacification of the central and proximal segmental pulmonary arteries.  No focal filling defects are demonstrated, suggesting no evidence of central  pulmonary embolus.  Smaller peripheral emboli may be obscured.  Normal caliber thoracic aorta.  No abnormal mediastinal fluid collections.  No significant lymphadenopathy in the chest.  The esophagus is decompressed. Postoperative changes consistent with bilateral mastectomies.  Surgical drains in the subcutaneous soft tissues of the anterior chest bilaterally. No discrete fluid collections.  Scattered infiltration in the subcutaneous fat.  No pleural effusions.  Visualization of the lung fields is  limited due to respiratory motion artifact but there appears to be consolidation or atelectasis in the medial aspect of the left upper lung.  No pneumothorax.  Visualized airways appear patent.  Degenerative changes in the lumbar spine.  IMPRESSION: No definite evidence of significant pulmonary embolus although suboptimal contrast bolus limits evaluation of distal vessels. Focal consolidation or atelectasis in the left upper lung medially. Postoperative changes in the anterior chest.  Original Report Authenticated By: Marlon Pel, M.D.   Dg Chest Port 1 View  01/11/2012  *RADIOLOGY REPORT*  Clinical Data: Dry cough, shortness of breath, and dizziness beginning yesterday.  Bilateral mastectomy 1 week ago for breast cancer.  Chest drains.  PORTABLE CHEST - 1 VIEW  Comparison: 05/09/2010  Findings: Shallow inspiration with elevation of the left hemidiaphragm.  Borderline heart size and pulmonary vascularity are likely normal for technique.  No focal airspace consolidation in the lungs.  No blunting of costophrenic angles.  No pneumothorax. Surgical drains bilaterally consistent with history of recent mastectomies.  IMPRESSION: Shallow inspiration with elevation of left hemidiaphragm.  No evidence of active pulmonary disease.  Surgical drains.  Original Report Authenticated By: Marlon Pel, M.D.   Assessment & Plan by Problem:  Tara Clarke is a 36 year old woman with a history of asthma status post bilateral mastectomies last week who presents with cough, shortness of breath, and fevers. She is found to have a left upper lobe apical sub-sub-segmental infiltrate consistent with community-acquired pneumonia. This is the likely explanation for her symptoms. Since admission she has some nausea. The cause of the nausea is unclear and may be related to the pneumonia. We also have to consider the possibility that the nausea may be secondary to some intervention she received here such as the  azithromycin.  1) Community acquired pneumonia: We will continue the ceftriaxone and azithromycin while she remains unable to take antibiotics orally. When she is feeling better from a GI standpoint we will convert her to oral antibiotics for the community-acquired pneumonia. We will make sure she is maintained on her inhalers, receives bronchodilators for mucociliary clearance, and Robitussin for her cough.  2) Nausea: We will treat her with when necessary Zofran.  3) Disposition: I feel Ms. Mccardle requires at least one more day in the hospital. If she is feeling better from a GI standpoint she may be ready for discharge within the next 24-48 hours.

## 2012-01-12 NOTE — Progress Notes (Signed)
Social visit  Patient's primary problem is CAP.  Doing well from mastectomies.  I will let Dr. Donell Beers know the patient is in the hospital.  Thanks,  Ovidio Kin, MD, [facility] Surgery Pager: [phone] Office phone:  [phone]

## 2012-01-13 ENCOUNTER — Encounter (INDEPENDENT_AMBULATORY_CARE_PROVIDER_SITE_OTHER): Payer: BC Managed Care – PPO | Admitting: General Surgery

## 2012-01-13 MED ORDER — POLYETHYLENE GLYCOL 3350 17 G PO PACK
17.0000 g | PACK | Freq: Two times a day (BID) | ORAL | Status: DC
  Administered 2012-01-13 – 2012-01-15 (×5): 17 g via ORAL
  Filled 2012-01-13 (×6): qty 1

## 2012-01-13 MED ORDER — BOOST / RESOURCE BREEZE PO LIQD
1.0000 | Freq: Three times a day (TID) | ORAL | Status: DC | PRN
  Administered 2012-01-13: 1 via ORAL

## 2012-01-13 MED ORDER — MORPHINE SULFATE 15 MG PO TABS
15.0000 mg | ORAL_TABLET | ORAL | Status: DC | PRN
  Administered 2012-01-13 – 2012-01-15 (×8): 15 mg via ORAL
  Filled 2012-01-13 (×5): qty 1
  Filled 2012-01-13: qty 3
  Filled 2012-01-13: qty 1

## 2012-01-13 MED ORDER — ENSURE COMPLETE PO LIQD
237.0000 mL | Freq: Three times a day (TID) | ORAL | Status: DC
  Administered 2012-01-14 (×2): 237 mL via ORAL
  Administered 2012-01-15: 08:00:00 via ORAL

## 2012-01-13 NOTE — Progress Notes (Signed)
Subjective: Pt feeling better today but still c decreased appetite and some nausea.  Breathing con't to be improved.   Objective: Vital signs in last 24 hours: Filed Vitals:   01/12/12 1546 01/12/12 2100 01/12/12 2209 01/13/12 0611  BP: 105/57  138/81 114/66  Pulse: 98  97 90  Temp: 99 F (37.2 C)  99.1 F (37.3 C) 98.6 F (37 C)  TempSrc: Oral     Resp: 16  18 18   Height:      Weight:      SpO2: 93% 94% 90% 93%   Weight change:   Intake/Output Summary (Last 24 hours) at 01/13/12 0836 Last data filed at 01/12/12 1900  Gross per 24 hour  Intake    480 ml  Output    530 ml  Net    -50 ml   Physical Exam: General: NAD HEENT: PERRL, EOMI, no scleral icterus  Cardiac: RRR, no rubs, murmurs or gallops  Pulm: CTAB Abd: soft, nontender, nondistended, BS present  Ext: warm and well perfused, no pedal edema   Lab Results: Basic Metabolic Panel:  Lab 01/12/12 7829 01/10/12 2324  NA 138 135  K 4.1 4.0  CL 105 100  CO2 25 24  GLUCOSE 113* 95  BUN 7 7  CREATININE 0.71 0.67  CALCIUM 8.7 9.3  MG -- --  PHOS -- --   Liver Function Tests:  Lab 01/12/12 0700  AST 12  ALT 5  ALKPHOS 55  BILITOT 0.2*  PROT 6.5  ALBUMIN 3.2*   CBC:  Lab 01/12/12 0700 01/10/12 2324  WBC 7.4 7.4  NEUTROABS -- 5.2  HGB 10.7* 11.1*  HCT 33.0* 34.3*  MCV 85.5 84.5  PLT 318 334   Urinalysis:  Lab 01/11/12 0007  COLORURINE YELLOW  LABSPEC 1.017  PHURINE 8.0  GLUCOSEU NEGATIVE  HGBUR NEGATIVE  BILIRUBINUR NEGATIVE  KETONESUR NEGATIVE  PROTEINUR NEGATIVE  UROBILINOGEN 0.2  NITRITE NEGATIVE  LEUKOCYTESUR NEGATIVE   Micro Results: Recent Results (from the past 240 hour(s))  URINE CULTURE     Status: Normal   Collection Time   01/11/12 12:07 AM      Component Value Range Status Comment   Specimen Description URINE, RANDOM   Final    Special Requests NONE   Final    Culture  Setup Time [id number]   Final    Colony Count 75,000 COLONIES/ML   Final    Culture     Final    Value: Multiple bacterial morphotypes present, none predominant. Suggest appropriate recollection if clinically indicated.   Report Status 01/12/2012 FINAL   Final   CULTURE, SPUTUM-ASSESSMENT     Status: Normal   Collection Time   01/11/12  8:42 PM      Component Value Range Status Comment   Specimen Description SPUTUM   Final    Special Requests NONE   Final    Sputum evaluation     Final    Value: THIS SPECIMEN IS ACCEPTABLE. RESPIRATORY CULTURE REPORT TO FOLLOW.   Report Status 01/11/2012 FINAL   Final   CULTURE, RESPIRATORY     Status: Normal (Preliminary result)   Collection Time   01/11/12  8:42 PM      Component Value Range Status Comment   Specimen Description SPUTUM   Final    Special Requests NONE   Final    Gram Stain     Final    Value: ABUNDANT WBC PRESENT,BOTH PMN AND MONONUCLEAR     FEW SQUAMOUS  EPITHELIAL CELLS PRESENT     RARE GRAM VARIABLE ROD     RARE GRAM NEGATIVE COCCI   Culture PENDING   Incomplete    Report Status PENDING   Incomplete    Studies/Results: No results found. Medications: I have reviewed the patient's current medications. Scheduled Meds:    . azithromycin  500 mg Intravenous Q24H  . cefTRIAXone (ROCEPHIN)  IV  1 g Intravenous Q24H  . Fluticasone-Salmeterol  1 puff Inhalation BID  . heparin  5,000 Units Subcutaneous Q8H  . sertraline  100 mg Oral QHS   Continuous Infusions:  PRN Meds:.acetaminophen, acetaminophen, albuterol, alum & mag hydroxide-simeth, eletriptan, EPINEPHrine, guaiFENesin, HYDROmorphone, ondansetron (ZOFRAN) IV, ondansetron, DISCONTD: fentaNYL  Assessment/Plan: CAP with SIRS Breathing improved, vitals stable.  Sputum culture still pending. - con't azithro/ceftriaxone - con't albuterol/advair - con't robitussin - con't spirometry  Nausea - could be 2/2 PNA vs poor bowel function.  Is on some pain meds, hasn't had good bm since surgery. - con't zofran - start miralax bid until bm  Asthma - No history of intubation,  takes advair at home with albuterol inhaler as needed. Will continue using her advair/albuterol inhalers and oxygen as needed.   History of recent mastectomy - Done for recurrent breast masses non-cancerous and strong family history of breast cancer. No evidence of cancer in the removed tissue. Site does not appear infected and steri strips are intact over healing granulation tissue. Two drains are in place and some irritation around the site without signs of active cellulitis. Drains are draining small amount of yellow drainage with slight bloody discharge from right drain. - seen today by surgery, plan to remove tomorrow possibly  DVT ppx - Heparin [place] TID  Disposition -  Aim for tomorrow   LOS: 3 days   [facility], BEN 01/13/2012, 8:36 AM

## 2012-01-13 NOTE — Progress Notes (Signed)
INITIAL ADULT NUTRITION ASSESSMENT Date: 01/13/2012   Time: 4:58 PM Reason for Assessment: consult  ASSESSMENT: Female 36 y.o.  Dx: PNA  Hx:  Past Medical History  Diagnosis Date  . Lymph node enlargement     left  . PONV (postoperative nausea and vomiting)     nausea  . Heart palpitations   . Headache     monthly with menses  . Asthma     daily and prn inhalers  . Depression   . Cancer 12/2011    breast    Related Meds:  Scheduled Meds:   . azithromycin  500 mg Intravenous Q24H  . cefTRIAXone (ROCEPHIN)  IV  1 g Intravenous Q24H  . Fluticasone-Salmeterol  1 puff Inhalation BID  . heparin  5,000 Units Subcutaneous Q8H  . polyethylene glycol  17 g Oral BID  . sertraline  100 mg Oral QHS   Continuous Infusions:  PRN Meds:.acetaminophen, acetaminophen, albuterol, alum & mag hydroxide-simeth, eletriptan, EPINEPHrine, guaiFENesin, morphine, ondansetron (ZOFRAN) IV, ondansetron, DISCONTD: HYDROmorphone   Ht: 5' 1.5" (156.2 cm)  Wt: 219 lb (99.338 kg)  Ideal Wt: 47.7 kg % Ideal Wt: 207%  Usual Wt: same % Usual Wt: 100%  Body mass index is 40.71 kg/(m^2).  Pt is morbidly obese  Food/Nutrition Related Hx: Pt s/p double mastectomy 12 days ago, now with PNA.  Nausea PTA.  Labs:  CMP     Component Value Date/Time   NA 138 01/12/2012 0700   K 4.1 01/12/2012 0700   CL 105 01/12/2012 0700   CO2 25 01/12/2012 0700   GLUCOSE 113* 01/12/2012 0700   BUN 7 01/12/2012 0700   CREATININE 0.71 01/12/2012 0700   CALCIUM 8.7 01/12/2012 0700   PROT 6.5 01/12/2012 0700   ALBUMIN 3.2* 01/12/2012 0700   AST 12 01/12/2012 0700   ALT 5 01/12/2012 0700   ALKPHOS 55 01/12/2012 0700   BILITOT 0.2* 01/12/2012 0700   GFRNONAA >90 01/12/2012 0700   GFRAA >90 01/12/2012 0700    CBC    Component Value Date/Time   WBC 7.4 01/12/2012 0700   RBC 3.86* 01/12/2012 0700   HGB 10.7* 01/12/2012 0700   HCT 33.0* 01/12/2012 0700   PLT 318 01/12/2012 0700   MCV 85.5 01/12/2012 0700   MCH 27.7 01/12/2012  0700   MCHC 32.4 01/12/2012 0700   RDW 16.3* 01/12/2012 0700   LYMPHSABS 1.4 01/10/2012 2324   MONOABS 0.7 01/10/2012 2324   EOSABS 0.1 01/10/2012 2324   BASOSABS 0.0 01/10/2012 2324    Intake: 0-50% meals Output:   Intake/Output Summary (Last 24 hours) at 01/13/12 1701 Last data filed at 01/13/12 1300  Gross per 24 hour  Intake      0 ml  Output     70 ml  Net    -70 ml    Diet Order: General  Supplements/Tube Feeding: none at this time  IVF:    Estimated Nutritional Needs:   Kcal: 1815-1940 kcal Protein: 78-84g protein Fluid: ~2.0 L/day  Pt reports nausea PTA which has continued, now with PNA.  She reports 1 episode of emesis which was extremely painful given her recent surgery.  Pt has self-limited her intake to prevent emesis in the future.  Pt reports plans to d/c tomorrow.  NUTRITION DIAGNOSIS: -Inadequate oral intake (NI-2.1).  Status: Ongoing  RELATED TO: nausea  AS EVIDENCE BY: pt report, PO <50% of meals  MONITORING/EVALUATION(Goals): 1.  Food/Beverage; pt to consume >50% of meals consistently.  Pt consuming at  least 3 supplements daily.  EDUCATION NEEDS: -Education needs addressed  INTERVENTION: 1.  General healthful diet; discussed needs for healing.  Pt questions whether she should just be on a liquid diet for now.  Encouraged pt to: -consume caloric beverages daily- sprite, gatorade, and juice which she has tolerated  -supplements 3 times daily,  -then consume bites of food as tolerated until she can resume a regular diet and is eating per her usual. Pt is ordering bland, well-tolerated foods. Encouraged to continue. 2.  Nutrition-related medications; pt would benefit from home anti-emetic if appropriate per MD.  Zofran has been working for her per her report. 3.  Supplements; Resource Breeze or Ensure Clinical Strength TID.  Pt reports clear liquids better tolerated right now, but is willing to try Ensure.  Dietitian #: [phone]  DOCUMENTATION  CODES Per approved criteria  -Morbid Obesity    Loyce Dys [facility] 01/13/2012, 4:58 PM

## 2012-01-13 NOTE — Progress Notes (Signed)
  Subjective: S/p Bilateral mastectomies.  Here for CAP  Objective: Vital signs in last 24 hours: Temp:  [98.6 F (37 C)-99.1 F (37.3 C)] 98.6 F (37 C) (04/01 0611) Pulse Rate:  [90-98] 90  (04/01 0611) Resp:  [16-18] 18  (04/01 0611) BP: (105-138)/(57-81) 114/66 mmHg (04/01 0611) SpO2:  [90 %-94 %] 93 % (04/01 0611) Last BM Date: 01/10/12  Intake/Output from previous day: 03/31 0701 - 04/01 0700 In: 720 [P.O.:720] Out: 780 [Urine:750; Drains:30] Intake/Output this shift:    Chest wall: no tenderness, Incisions clean/no erythema.  No evidence of hematoma  Lab Results:   Basename 01/12/12 0700 01/10/12 2324  WBC 7.4 7.4  HGB 10.7* 11.1*  HCT 33.0* 34.3*  PLT 318 334   BMET  Basename 01/12/12 0700 01/10/12 2324  NA 138 135  K 4.1 4.0  CL 105 100  CO2 25 24  GLUCOSE 113* 95  BUN 7 7  CREATININE 0.71 0.67  CALCIUM 8.7 9.3   PT/INR No results found for this basename: LABPROT:2,INR:2 in the last 72 hours ABG No results found for this basename: PHART:2,PCO2:2,PO2:2,HCO3:2 in the last 72 hours  Studies/Results: No results found.  Anti-infectives: Anti-infectives     Start     Dose/Rate Route Frequency Ordered Stop   01/11/12 1200   cefTRIAXone (ROCEPHIN) 1 g in dextrose 5 % 50 mL IVPB        1 g 100 mL/hr over 30 Minutes Intravenous Every 24 hours 01/11/12 1054     01/11/12 1200   azithromycin (ZITHROMAX) 500 mg in dextrose 5 % 250 mL IVPB        500 mg 250 mL/hr over 60 Minutes Intravenous Every 24 hours 01/11/12 1054     01/11/12 0700   vancomycin (VANCOCIN) IVPB 1000 mg/200 mL premix  Status:  Discontinued        1,000 mg 200 mL/hr over 60 Minutes Intravenous Every 8 hours 01/11/12 0544 01/11/12 1054   01/11/12 0600   ceFEPIme (MAXIPIME) 1 g in dextrose 5 % 50 mL IVPB  Status:  Discontinued        1 g 100 mL/hr over 30 Minutes Intravenous Every 12 hours 01/11/12 0457 01/11/12 1121   01/11/12 0600   levofloxacin (LEVAQUIN) IVPB 750 mg  Status:   Discontinued        750 mg 100 mL/hr over 90 Minutes Intravenous Every 24 hours 01/11/12 0457 01/11/12 1139          Assessment/Plan: s/p * No surgery found * Hope to pull drains tomorrow before discharge. Will see what output is.    LOS: 3 days    [facility] 01/13/2012

## 2012-01-14 LAB — DIFFERENTIAL
Basophils Absolute: 0 10*3/uL (ref 0.0–0.1)
Basophils Relative: 0 % (ref 0–1)
Lymphocytes Relative: 39 % (ref 12–46)
Monocytes Absolute: 0.6 10*3/uL (ref 0.1–1.0)
Neutro Abs: 2.1 10*3/uL (ref 1.7–7.7)
Neutrophils Relative %: 45 % (ref 43–77)

## 2012-01-14 LAB — CULTURE, RESPIRATORY W GRAM STAIN: Culture: NORMAL

## 2012-01-14 LAB — CBC
HCT: 32 % — ABNORMAL LOW (ref 36.0–46.0)
MCHC: 32.2 g/dL (ref 30.0–36.0)
RDW: 16 % — ABNORMAL HIGH (ref 11.5–15.5)
WBC: 4.8 10*3/uL (ref 4.0–10.5)

## 2012-01-14 MED ORDER — ALBUTEROL SULFATE (5 MG/ML) 0.5% IN NEBU
2.5000 mg | INHALATION_SOLUTION | Freq: Four times a day (QID) | RESPIRATORY_TRACT | Status: DC
  Administered 2012-01-14 – 2012-01-15 (×4): 2.5 mg via RESPIRATORY_TRACT
  Filled 2012-01-14 (×3): qty 0.5

## 2012-01-14 MED ORDER — ENOXAPARIN SODIUM 40 MG/0.4ML ~~LOC~~ SOLN
40.0000 mg | SUBCUTANEOUS | Status: DC
  Administered 2012-01-14 – 2012-01-15 (×2): 40 mg via SUBCUTANEOUS
  Filled 2012-01-14 (×2): qty 0.4

## 2012-01-14 MED ORDER — FLEET ENEMA 7-19 GM/118ML RE ENEM
1.0000 | ENEMA | Freq: Once | RECTAL | Status: AC
  Administered 2012-01-14: 16:00:00 via RECTAL
  Filled 2012-01-14: qty 1

## 2012-01-14 MED ORDER — MOXIFLOXACIN HCL IN NACL 400 MG/250ML IV SOLN
400.0000 mg | INTRAVENOUS | Status: DC
  Administered 2012-01-14: 400 mg via INTRAVENOUS
  Filled 2012-01-14 (×2): qty 250

## 2012-01-14 NOTE — Progress Notes (Signed)
UR Completed. Simmons, Lilleigh F [phone]  

## 2012-01-14 NOTE — Discharge Instructions (Addendum)
Dry dressing as needed to drain sites. Follow up with Dr. Donell Beers in 3 weeks unless it seems that fluid is building up under flaps.

## 2012-01-14 NOTE — Progress Notes (Signed)
Patient ID: Tara Clarke, female   DOB: [birth year], 36 y.o.   MRN: [id number]    Subjective: Increased fever yesterday.  Drain output too high for drain removal.    Objective: Vital signs in last 24 hours: Temp:  [98.9 F (37.2 C)-102.2 F (39 C)] 98.9 F (37.2 C) (04/02 0618) Pulse Rate:  [98-116] 98  (04/02 0618) Resp:  [18] 18  (04/02 0618) BP: (118-130)/(60-68) 118/60 mmHg (04/02 0618) SpO2:  [90 %-97 %] 90 % (04/02 0618) Last BM Date: 02/10/12  Intake/Output from previous day: 04/01 0701 - 04/02 0700 In: 1200 [P.O.:1200] Out: 115 [Drains:115] Intake/Output this shift:    Chest wall: no tenderness, Incisions clean/no erythema.  No evidence of hematoma  Lab Results:   Basename 01/12/12 0700  WBC 7.4  HGB 10.7*  HCT 33.0*  PLT 318   BMET  Basename 01/12/12 0700  NA 138  K 4.1  CL 105  CO2 25  GLUCOSE 113*  BUN 7  CREATININE 0.71  CALCIUM 8.7   PT/INR No results found for this basename: LABPROT:2,INR:2 in the last 72 hours ABG No results found for this basename: PHART:2,PCO2:2,PO2:2,HCO3:2 in the last 72 hours  Studies/Results: No results found.  Anti-infectives: Anti-infectives     Start     Dose/Rate Route Frequency Ordered Stop   01/11/12 1200   cefTRIAXone (ROCEPHIN) 1 g in dextrose 5 % 50 mL IVPB        1 g 100 mL/hr over 30 Minutes Intravenous Every 24 hours 01/11/12 1054     01/11/12 1200   azithromycin (ZITHROMAX) 500 mg in dextrose 5 % 250 mL IVPB        500 mg 250 mL/hr over 60 Minutes Intravenous Every 24 hours 01/11/12 1054     01/11/12 0700   vancomycin (VANCOCIN) IVPB 1000 mg/200 mL premix  Status:  Discontinued        1,000 mg 200 mL/hr over 60 Minutes Intravenous Every 8 hours 01/11/12 0544 01/11/12 1054   01/11/12 0600   ceFEPIme (MAXIPIME) 1 g in dextrose 5 % 50 mL IVPB  Status:  Discontinued        1 g 100 mL/hr over 30 Minutes Intravenous Every 12 hours 01/11/12 0457 01/11/12 1121   01/11/12 0600   levofloxacin (LEVAQUIN)  IVPB 750 mg  Status:  Discontinued        750 mg 100 mL/hr over 90 Minutes Intravenous Every 24 hours 01/11/12 0457 01/11/12 1139          Assessment/Plan: s/p * Bilateral mastectomies* Continue drains. Need 20 mL or less/24 hours in each drain prior to d/c. Will check daily while she is here. If able to go home and drains are still in, will need to see in 1-2 weeks.    LOS: 4 days    [facility] 01/14/2012

## 2012-01-14 NOTE — Progress Notes (Signed)
Subjective: Fever to 102 overnight, decreased with apap, nl temp this a.m.  Pt still coughing, feeling very poorly.  + pleuritic CP with cough.  Objective: Vital signs in last 24 hours: Filed Vitals:   01/13/12 1357 01/13/12 2030 01/13/12 2120 01/14/12 0618  BP: 130/68  119/64 118/60  Pulse: 111  116 98  Temp: 99 F (37.2 C) 102.2 F (39 C) 101.4 F (38.6 C) 98.9 F (37.2 C)  TempSrc:   Oral   Resp: 18  18 18   Height:      Weight:      SpO2: 97%  91% 90%   Weight change:   Intake/Output Summary (Last 24 hours) at 01/14/12 0749 Last data filed at 01/14/12 0630  Gross per 24 hour  Intake   1200 ml  Output    115 ml  Net   1085 ml   Physical Exam: General: NAD HEENT: PERRL, EOMI, no scleral icterus  Cardiac: tachycardic rate, no rubs, murmurs or gallops  Pulm: CTAB, b/l chest drains in place, minimal erythema surrounding insertion sites.  L drain +TTP around insertion. Abd: soft, nontender, nondistended, BS present  Ext: warm and well perfused, no pedal edema   Lab Results: Basic Metabolic Panel:  Lab 01/12/12 9562 01/10/12 2324  NA 138 135  K 4.1 4.0  CL 105 100  CO2 25 24  GLUCOSE 113* 95  BUN 7 7  CREATININE 0.71 0.67  CALCIUM 8.7 9.3  MG -- --  PHOS -- --   Liver Function Tests:  Lab 01/12/12 0700  AST 12  ALT 5  ALKPHOS 55  BILITOT 0.2*  PROT 6.5  ALBUMIN 3.2*   CBC:  Lab 01/12/12 0700 01/10/12 2324  WBC 7.4 7.4  NEUTROABS -- 5.2  HGB 10.7* 11.1*  HCT 33.0* 34.3*  MCV 85.5 84.5  PLT 318 334   Urinalysis:  Lab 01/11/12 0007  COLORURINE YELLOW  LABSPEC 1.017  PHURINE 8.0  GLUCOSEU NEGATIVE  HGBUR NEGATIVE  BILIRUBINUR NEGATIVE  KETONESUR NEGATIVE  PROTEINUR NEGATIVE  UROBILINOGEN 0.2  NITRITE NEGATIVE  LEUKOCYTESUR NEGATIVE   Micro Results: Recent Results (from the past 240 hour(s))  URINE CULTURE     Status: Normal   Collection Time   01/11/12 12:07 AM      Component Value Range Status Comment   Specimen Description URINE,  RANDOM   Final    Special Requests NONE   Final    Culture  Setup Time [id number]   Final    Colony Count 75,000 COLONIES/ML   Final    Culture     Final    Value: Multiple bacterial morphotypes present, none predominant. Suggest appropriate recollection if clinically indicated.   Report Status 01/12/2012 FINAL   Final   CULTURE, SPUTUM-ASSESSMENT     Status: Normal   Collection Time   01/11/12  8:42 PM      Component Value Range Status Comment   Specimen Description SPUTUM   Final    Special Requests NONE   Final    Sputum evaluation     Final    Value: THIS SPECIMEN IS ACCEPTABLE. RESPIRATORY CULTURE REPORT TO FOLLOW.   Report Status 01/11/2012 FINAL   Final   CULTURE, RESPIRATORY     Status: Normal (Preliminary result)   Collection Time   01/11/12  8:42 PM      Component Value Range Status Comment   Specimen Description SPUTUM   Final    Special Requests NONE   Final  Gram Stain     Final    Value: ABUNDANT WBC PRESENT,BOTH PMN AND MONONUCLEAR     FEW SQUAMOUS EPITHELIAL CELLS PRESENT     RARE GRAM VARIABLE ROD     RARE GRAM NEGATIVE COCCI   Culture NORMAL OROPHARYNGEAL FLORA   Final    Report Status PENDING   Incomplete    Studies/Results: No results found. Medications: I have reviewed the patient's current medications. Scheduled Meds:    . azithromycin  500 mg Intravenous Q24H  . cefTRIAXone (ROCEPHIN)  IV  1 g Intravenous Q24H  . feeding supplement  237 mL Oral TID WC  . Fluticasone-Salmeterol  1 puff Inhalation BID  . heparin  5,000 Units Subcutaneous Q8H  . polyethylene glycol  17 g Oral BID  . sertraline  100 mg Oral QHS   Continuous Infusions:  PRN Meds:.acetaminophen, acetaminophen, albuterol, alum & mag hydroxide-simeth, eletriptan, EPINEPHrine, feeding supplement, guaiFENesin, morphine, ondansetron (ZOFRAN) IV, ondansetron, DISCONTD: HYDROmorphone  Assessment/Plan: CAP with SIRS Fever to 102 overnight, defervesced with tylenol.  Pt is hypoxic, con't to  have mildly productive cough, and has significant pleuritic pain with cough.  Unclear if fever is 2/2 presumed PNA.  Has been on abx for 3 days, respiratory culture showed oropharyngeal flora.  Urine legionella/strep both negative.  No blood cultures drawn. - switch to avelox IV - con't albuterol/advair - repeat CBC c diff - start albuterol nebulizer scheduled for 6 doses - con't robitussin - con't spirometry  Fever Likely 2/2 PNA, but fever last night is increased from past few days.  Urine was clean, respiratory culture showed normal oropharyngeal flora.  Is s/p b/l mastectomy on 3/20, still with output from drains.  Seen by surgery this a.m., drains to stay in place.  Chest wall shows no erythema or tenderness.  CT chest shows no fluid collections.  Could also be drug rxn, as pt has multiple drug sensitivities. - con't abx - consider blood cx, but pt already on abx  Nausea Improved.  Could be 2/2 PNA vs poor bowel function.  Is on some pain meds, hasn't had good bm since surgery. - con't zofran - start miralax bid until bm - fleets enema today  Asthma No history of intubation, takes advair at home with albuterol inhaler as needed. Will continue using her advair/albuterol inhalers and oxygen as needed. - albuterol nebs   History of recent mastectomy on 01/01/12 Done for recurrent breast masses non-cancerous and strong family history of breast cancer. No evidence of cancer in the removed tissue. Site does not appear infected and steri strips are intact over healing granulation tissue. Two drains are in place and some irritation around the site without signs of active cellulitis. Drains are draining small amount of yellow drainage with slight bloody discharge from right drain. - do not think drains are causing fever, but appreciate surgery team input - seen by surgery daily while in hospital, see their progress note for details  DVT ppx - lovenox   LOS: 4 days   [facility],  BEN 01/14/2012, 7:49 AM

## 2012-01-15 MED ORDER — MOXIFLOXACIN HCL 400 MG PO TABS
400.0000 mg | ORAL_TABLET | Freq: Every day | ORAL | Status: DC
  Administered 2012-01-15: 400 mg via ORAL
  Filled 2012-01-15: qty 1

## 2012-01-15 MED ORDER — PREDNISONE 50 MG PO TABS: 50.0000 mg | ORAL_TABLET | Freq: Every day | ORAL | Status: DC

## 2012-01-15 MED ORDER — PREDNISONE 50 MG PO TABS
50.0000 mg | ORAL_TABLET | Freq: Every day | ORAL | Status: DC
  Administered 2012-01-15: 50 mg via ORAL
  Filled 2012-01-15 (×2): qty 1

## 2012-01-15 MED ORDER — MORPHINE SULFATE 2 MG/ML IJ SOLN
2.0000 mg | Freq: Once | INTRAMUSCULAR | Status: AC
  Administered 2012-01-15: 2 mg via INTRAVENOUS
  Filled 2012-01-15: qty 1

## 2012-01-15 MED ORDER — MOXIFLOXACIN HCL 400 MG PO TABS: 400.0000 mg | ORAL_TABLET | Freq: Every day | ORAL | Status: AC

## 2012-01-15 MED ORDER — FLEET ENEMA 7-19 GM/118ML RE ENEM
1.0000 | ENEMA | Freq: Once | RECTAL | Status: DC
  Filled 2012-01-15: qty 1

## 2012-01-15 MED ORDER — ALBUTEROL SULFATE (5 MG/ML) 0.5% IN NEBU
2.5000 mg | INHALATION_SOLUTION | Freq: Four times a day (QID) | RESPIRATORY_TRACT | Status: DC
  Administered 2012-01-15: 2.5 mg via RESPIRATORY_TRACT
  Filled 2012-01-15: qty 0.5

## 2012-01-15 MED ORDER — POLYETHYLENE GLYCOL 3350 17 G PO PACK: 17.0000 g | PACK | Freq: Two times a day (BID) | ORAL | Status: AC

## 2012-01-15 MED ORDER — DIPHENHYDRAMINE HCL 25 MG PO CAPS
25.0000 mg | ORAL_CAPSULE | Freq: Three times a day (TID) | ORAL | Status: DC
  Administered 2012-01-15: 25 mg via ORAL
  Filled 2012-01-15: qty 1

## 2012-01-15 MED ORDER — ONDANSETRON HCL 4 MG PO TABS: 4.0000 mg | ORAL_TABLET | Freq: Four times a day (QID) | ORAL | Status: AC | PRN

## 2012-01-15 NOTE — Progress Notes (Signed)
Patient ID: Tara Clarke, female   DOB: 05/13/1976, 36 y.o.   MRN: [id number]    Subjective: Drain output decreased.  Still requiring oxygen  Objective: Vital signs in last 24 hours: Temp:  [97.8 F (36.6 C)-98.9 F (37.2 C)] 98.1 F (36.7 C) (04/03 0612) Pulse Rate:  [85-90] 85  (04/03 0612) Resp:  [16-20] 20  (04/03 0612) BP: (109-124)/(61-71) 124/61 mmHg (04/03 0612) SpO2:  [93 %-98 %] 97 % (04/03 0612) Last BM Date: 01/10/12  Intake/Output from previous day: 04/02 0701 - 04/03 0700 In: 720 [P.O.:720] Out: 60 [Drains:60] Intake/Output this shift:    Chest wall: no tenderness, Incisions clean/no erythema.  No evidence of hematoma  Lab Results:   Basename 01/14/12 1145  WBC 4.8  HGB 10.3*  HCT 32.0*  PLT 277   BMET No results found for this basename: NA:2,K:2,CL:2,CO2:2,GLUCOSE:2,BUN:2,CREATININE:2,CALCIUM:2 in the last 72 hours PT/INR No results found for this basename: LABPROT:2,INR:2 in the last 72 hours ABG No results found for this basename: PHART:2,PCO2:2,PO2:2,HCO3:2 in the last 72 hours  Studies/Results: No results found.  Anti-infectives: Anti-infectives     Start     Dose/Rate Route Frequency Ordered Stop   01/14/12 1200   moxifloxacin (AVELOX) IVPB 400 mg        400 mg 250 mL/hr over 60 Minutes Intravenous Every 24 hours 01/14/12 1046     01/11/12 1200   cefTRIAXone (ROCEPHIN) 1 g in dextrose 5 % 50 mL IVPB  Status:  Discontinued        1 g 100 mL/hr over 30 Minutes Intravenous Every 24 hours 01/11/12 1054 01/14/12 1046   01/11/12 1200   azithromycin (ZITHROMAX) 500 mg in dextrose 5 % 250 mL IVPB  Status:  Discontinued        500 mg 250 mL/hr over 60 Minutes Intravenous Every 24 hours 01/11/12 1054 01/14/12 1046   01/11/12 0700   vancomycin (VANCOCIN) IVPB 1000 mg/200 mL premix  Status:  Discontinued        1,000 mg 200 mL/hr over 60 Minutes Intravenous Every 8 hours 01/11/12 0544 01/11/12 1054   01/11/12 0600   ceFEPIme (MAXIPIME) 1 g in  dextrose 5 % 50 mL IVPB  Status:  Discontinued        1 g 100 mL/hr over 30 Minutes Intravenous Every 12 hours 01/11/12 0457 01/11/12 1121   01/11/12 0600   levofloxacin (LEVAQUIN) IVPB 750 mg  Status:  Discontinued        750 mg 100 mL/hr over 90 Minutes Intravenous Every 24 hours 01/11/12 0457 01/11/12 1139          Assessment/Plan: s/p * Bilateral mastectomies* D/c bilateral JPs. Follow up with me in 3 weeks.     LOS: 5 days    [facility] 01/15/2012

## 2012-01-15 NOTE — Discharge Summary (Signed)
Internal Medicine [facility] Discharge Note  Name: Tara Clarke MRN: [id number] DOB: 11/09/1975 36 y.o.  Date of Admission: 01/10/2012 10:13 PM Date of Discharge: 01/15/2012 Attending Physician: Ulyess Mort, MD  Discharge Diagnosis: 1. Community acquired PNA 2. Nausea and constipation 3. Asthma 4. S/p bilatearl mastectomy on 01/01/12  Discharge Medications: Medication List  As of 01/15/2012  1:54 PM   TAKE these medications         albuterol 108 (90 BASE) MCG/ACT inhaler   Commonly known as: PROVENTIL HFA;VENTOLIN HFA   Inhale 2 puffs into the lungs as needed.      CALCIUM + D PO   Take 500 mg by mouth 2 (two) times daily.      eletriptan 40 MG tablet   Commonly known as: RELPAX   One tablet by mouth as needed for migraine headache.  If the headache improves and then returns, dose may be repeated after 2 hours have elapsed since first dose (do not exceed 80 mg per day). may repeat in 2 hours if necessary. HEADACHE        EPINEPHrine 0.15 MG/0.3ML injection   Commonly known as: EPIPEN JR   Inject 0.15 mg into the muscle as needed. Uses adult dose epi pen prn      Fluticasone-Salmeterol 500-50 MCG/DOSE Aepb   Commonly known as: ADVAIR   Inhale 1 puff into the lungs every 12 (twelve) hours.      HYDROmorphone 2 MG tablet   Commonly known as: DILAUDID   Take 2 mg by mouth every 4 (four) hours as needed. For pain      ibuprofen 200 MG tablet   Commonly known as: ADVIL,MOTRIN   Take 400 mg by mouth every 6 (six) hours as needed. For fever      moxifloxacin 400 MG tablet   Commonly known as: AVELOX   Take 1 tablet (400 mg total) by mouth daily at 6 PM.      ondansetron 4 MG tablet   Commonly known as: ZOFRAN   Take 1 tablet (4 mg total) by mouth every 6 (six) hours as needed for nausea.      polyethylene glycol packet   Commonly known as: MIRALAX / GLYCOLAX   Take 17 g by mouth 2 (two) times daily.      predniSONE 50 MG tablet   Commonly known as:  DELTASONE   Take 1 tablet (50 mg total) by mouth daily.      sertraline 100 MG tablet   Commonly known as: ZOLOFT   Take 100 mg by mouth at bedtime.            Disposition and follow-up:   Tara Clarke was discharged from [facility] in Stable condition.    Follow-up Appointments: Follow-up Information    Follow up with [facility], MD. Schedule an appointment as soon as possible for a visit in 3 weeks.   Contact information:   [employer] [address] [place] [address] [phone]       Follow up with [facility] on 01/23/2012. (8:30am)         Discharge Orders    Future Orders Please Complete By Expires   Diet - low sodium heart healthy      Increase activity slowly      Discharge instructions      Comments:   1. Please finish antibiotics completely 2. Please use miralax twice daily if  you continue to have constipation 3. Use albuterol as needed for cough and shortness of breath 4. Can use over-the-counter robitussen (or other meds) for cough      Consultations:    Procedures Performed:  Ct Angio Chest W/cm &/or Wo Cm  01/11/2012  *RADIOLOGY REPORT*  Clinical Data: Short of breath.  Lightheaded.  Cough.  Low grade fever.  Bilateral mastectomy on 01/01/2012  CT ANGIOGRAPHY CHEST  Technique:  Multidetector CT imaging of the chest using the standard protocol during bolus administration of intravenous contrast. Multiplanar reconstructed images including MIPs were obtained and reviewed to evaluate the vascular anatomy.  Contrast: OMNIPAQUE IOHEXOL 350 MG/ML IV SOLN  Comparison: 04/18/2011  Findings: Suboptimal contrast bolus results in moderate opacification of the central and proximal segmental pulmonary arteries.  No focal filling defects are demonstrated, suggesting no evidence of central pulmonary embolus.  Smaller peripheral emboli may be obscured.  Normal caliber thoracic  aorta.  No abnormal mediastinal fluid collections.  No significant lymphadenopathy in the chest.  The esophagus is decompressed. Postoperative changes consistent with bilateral mastectomies.  Surgical drains in the subcutaneous soft tissues of the anterior chest bilaterally. No discrete fluid collections.  Scattered infiltration in the subcutaneous fat.  No pleural effusions.  Visualization of the lung fields is limited due to respiratory motion artifact but there appears to be consolidation or atelectasis in the medial aspect of the left upper lung.  No pneumothorax.  Visualized airways appear patent.  Degenerative changes in the lumbar spine.  IMPRESSION: No definite evidence of significant pulmonary embolus although suboptimal contrast bolus limits evaluation of distal vessels. Focal consolidation or atelectasis in the left upper lung medially. Postoperative changes in the anterior chest.  Original Report Authenticated By: Marlon Pel, M.D.   Dg Chest Port 1 View  01/11/2012  *RADIOLOGY REPORT*  Clinical Data: Dry cough, shortness of breath, and dizziness beginning yesterday.  Bilateral mastectomy 1 week ago for breast cancer.  Chest drains.  PORTABLE CHEST - 1 VIEW  Comparison: 05/09/2010  Findings: Shallow inspiration with elevation of the left hemidiaphragm.  Borderline heart size and pulmonary vascularity are likely normal for technique.  No focal airspace consolidation in the lungs.  No blunting of costophrenic angles.  No pneumothorax. Surgical drains bilaterally consistent with history of recent mastectomies.  IMPRESSION: Shallow inspiration with elevation of left hemidiaphragm.  No evidence of active pulmonary disease.  Surgical drains.  Original Report Authenticated By: Marlon Pel, M.D.    Admission HPI: The patient is a 36 YO female who comes in with 2 day history of cough, SOB, fevers. Cough is non-productive. She had bilateral mastectomy on 3/20 for recurrent breast masses (no  cancer identified) with strong family history of breast cancer. She was healing appropriately and saw her surgeon for 1 week follow up on 3/26. 2 drains are still in place and intact with some mild erythema and not infected looking. Pt has appointment to remove drains on Monday with her surgeon, Dr. Donell Beers. She did use ibuprofen and tylenol for post-op pain until Wednesday when she did get dilaudid PO from her surgeon so she could have day free of pain on Thursday when her husband was able to care for the children. She woke up Friday with cough, SOB, feeling bad. Breathing did get worse and she does have past medical history significant for asthma, no intubation history. She states she does usually get pneumonia once a year and does not normally stay in the hospital for it.  She has last been hospitalized in September. She had less than 24 hour stay for mastectomy at out-pt surgical center. Non-smoker, never-smoker. Was up and walking and doing housework [place], [place], [place] of this week. Some nausea, no vomiting, no diarrhea, no chest pain, no abdominal pain. Pain at site of mastectomy especially with coughing. No new leg swelling.    Hospital Course by problem list:  CAP with SIRS  Patient presented with cough, shortness of breath, and fevers, with fevers as high as 102 in the hospital.  Though CXR was negative, CT scan showed possible infiltrate, so pt was started on ceftriaxone and azithromycin for presumed pneumonia and transitioned to Avelox. She was discharged on oral Avelox for a total of 10 days of antibiotics. Was also treated symptomatically with robitussin and benadryl.  Was afebrile for over 24 hours at the time of discharge. Was still having some diffuse wheezing and likely some component of reactive airway disease, so was also discharged on a brief course of steroids (see below for further details).  Nausea & Constipation  Patient complained of continued nausea without vomiting on hospital. Was  treated with Zofran. Also complained of constipation, and had not had a good bowel movement with twice-daily MiraLAX as well as enema. She was passing flatus. Was discharged on continued MiraLAX. Should be followed up with primary doctor.  Asthma  No history of intubation, takes advair at home with albuterol inhaler as needed. Did have some increased wheezing during the admission, so was given albuterol nebulizers and was discharged on a short prednisone course (50mg  x 7 days). Can be followed up by her PCP.  History of recent mastectomy on 01/01/12  Done for recurrent breast masses non-cancerous and strong family history of breast cancer. No evidence of cancer in the removed tissue. Site did not appear infected and steri strips were intact over healing granulation tissue. Two drains were in place, but the patient was seen by her surgeon during admission and the drains were pulled the day of discharge.  Anemia Mild, the patient had started menstruation during hospitalization.   Discharge Vitals:  BP 114/66  Pulse 85  Temp(Src) 98.5 F (36.9 C) (Oral)  Resp 16  Ht 5' 1.5" (1.562 m)  Wt 219 lb (99.338 kg)  BMI 40.71 kg/m2  SpO2 93%  LMP 12/13/2011  Discharge Labs: No results found for this or any previous visit (from the past 24 hour(s)).  SignedAbner Greenspan, BEN 01/15/2012, 1:54 PM

## 2012-01-15 NOTE — Progress Notes (Signed)
Subjective: Afebrile overnight, still no BM.  Feeling poorly overall but slightly improved from yesterday.  Objective: Vital signs in last 24 hours: Filed Vitals:   01/14/12 1929 01/14/12 2111 01/15/12 0214 01/15/12 0612  BP:  124/65  124/61  Pulse:  85  85  Temp:  98.9 F (37.2 C)  98.1 F (36.7 C)  TempSrc:      Resp:  18  20  Height:      Weight:      SpO2: 95% 98% 93% 97%   Weight change:   Intake/Output Summary (Last 24 hours) at 01/15/12 0841 Last data filed at 01/15/12 0641  Gross per 24 hour  Intake    720 ml  Output     60 ml  Net    660 ml   Physical Exam: General: NAD Cardiac: tachycardic rate, no rubs, murmurs or gallops  Pulm: diffuse end expiratory wheezes, rhonchi on L Abd: soft, nontender, nondistended, BS present  Ext: warm and well perfused, no pedal edema   Lab Results: Basic Metabolic Panel:  Lab 01/12/12 1610 01/10/12 2324  NA 138 135  K 4.1 4.0  CL 105 100  CO2 25 24  GLUCOSE 113* 95  BUN 7 7  CREATININE 0.71 0.67  CALCIUM 8.7 9.3  MG -- --  PHOS -- --   Liver Function Tests:  Lab 01/12/12 0700  AST 12  ALT 5  ALKPHOS 55  BILITOT 0.2*  PROT 6.5  ALBUMIN 3.2*   CBC:  Lab 01/14/12 1145 01/12/12 0700 01/10/12 2324  WBC 4.8 7.4 --  NEUTROABS 2.1 -- 5.2  HGB 10.3* 10.7* --  HCT 32.0* 33.0* --  MCV 86.0 85.5 --  PLT 277 318 --   Urinalysis:  Lab 01/11/12 0007  COLORURINE YELLOW  LABSPEC 1.017  PHURINE 8.0  GLUCOSEU NEGATIVE  HGBUR NEGATIVE  BILIRUBINUR NEGATIVE  KETONESUR NEGATIVE  PROTEINUR NEGATIVE  UROBILINOGEN 0.2  NITRITE NEGATIVE  LEUKOCYTESUR NEGATIVE   Micro Results: Recent Results (from the past 240 hour(s))  URINE CULTURE     Status: Normal   Collection Time   01/11/12 12:07 AM      Component Value Range Status Comment   Specimen Description URINE, RANDOM   Final    Special Requests NONE   Final    Culture  Setup Time [id number]   Final    Colony Count 75,000 COLONIES/ML   Final    Culture      Final    Value: Multiple bacterial morphotypes present, none predominant. Suggest appropriate recollection if clinically indicated.   Report Status 01/12/2012 FINAL   Final   CULTURE, SPUTUM-ASSESSMENT     Status: Normal   Collection Time   01/11/12  8:42 PM      Component Value Range Status Comment   Specimen Description SPUTUM   Final    Special Requests NONE   Final    Sputum evaluation     Final    Value: THIS SPECIMEN IS ACCEPTABLE. RESPIRATORY CULTURE REPORT TO FOLLOW.   Report Status 01/11/2012 FINAL   Final   CULTURE, RESPIRATORY     Status: Normal   Collection Time   01/11/12  8:42 PM      Component Value Range Status Comment   Specimen Description SPUTUM   Final    Special Requests NONE   Final    Gram Stain     Final    Value: ABUNDANT WBC PRESENT,BOTH PMN AND MONONUCLEAR     FEW SQUAMOUS  EPITHELIAL CELLS PRESENT     RARE GRAM VARIABLE ROD     RARE GRAM NEGATIVE COCCI   Culture NORMAL OROPHARYNGEAL FLORA   Final    Report Status 01/14/2012 FINAL   Final    Studies/Results: No results found. Medications: I have reviewed the patient's current medications. Scheduled Meds:    . albuterol  2.5 mg Nebulization Q6H  . diphenhydrAMINE  25 mg Oral Q8H  . enoxaparin (LOVENOX) injection  40 mg Subcutaneous Q24H  . feeding supplement  237 mL Oral TID WC  . Fluticasone-Salmeterol  1 puff Inhalation BID  .  morphine injection  2 mg Intravenous Once  . moxifloxacin  400 mg Intravenous Q24H  . polyethylene glycol  17 g Oral BID  . predniSONE  50 mg Oral Q breakfast  . sertraline  100 mg Oral QHS  . sodium phosphate  1 enema Rectal Once  . DISCONTD: albuterol  2.5 mg Nebulization Q6H  . DISCONTD: azithromycin  500 mg Intravenous Q24H  . DISCONTD: cefTRIAXone (ROCEPHIN)  IV  1 g Intravenous Q24H   Continuous Infusions:  PRN Meds:.acetaminophen, acetaminophen, albuterol, alum & mag hydroxide-simeth, eletriptan, EPINEPHrine, feeding supplement, guaiFENesin, morphine, ondansetron  (ZOFRAN) IV, ondansetron  Assessment/Plan: CAP with SIRS Afebrile overnight, still requiring some O2, + cough.  CBC with diff shows no acute changes. Still on abx.  Albuterol nebs helped, but pt sounds more wheezy today.  Does have h/o asthma, do likely some reactive airway component as well. - con't avelox IV - con't albuterol/advair - con't albuterol nebulizer scheduled q6h - start prednisone 50mg  daily - start benadryl 25mg  q8 - con't robitussin - con't spirometry  Fever Afebrile overnight, still likely 2/2 PNA.  Urine was clean, respiratory culture showed normal oropharyngeal flora.  Is s/p b/l mastectomy on 3/20, still with output from drains.  Seen by surgery this a.m., drains to come out today.  Chest wall shows no erythema or tenderness.  CT chest shows no fluid collections.  Could also be drug rxn, as pt has multiple drug sensitivities. - con't abx  Nausea & Constipation Passing flatus but no bm yet, even with miralax and fleets.  Nausea is improved. - con't zofran - con't miralax bid until bm - consider repeat enema  Asthma No history of intubation, takes advair at home with albuterol inhaler as needed. Will continue using her advair/albuterol inhalers and oxygen as needed. - albuterol nebs  - con't inhalers - steroids per above  History of recent mastectomy on 01/01/12 Done for recurrent breast masses non-cancerous and strong family history of breast cancer. No evidence of cancer in the removed tissue. Site does not appear infected and steri strips are intact over healing granulation tissue. Two drains are in place and some irritation around the site without signs of active cellulitis. - drains out today per surgery  DVT ppx - lovenox   LOS: 5 days   [facility], BEN 01/15/2012, 8:41 AM

## 2012-01-16 ENCOUNTER — Telehealth (INDEPENDENT_AMBULATORY_CARE_PROVIDER_SITE_OTHER): Payer: Self-pay | Admitting: General Surgery

## 2012-01-16 NOTE — Telephone Encounter (Signed)
Pt reports she was in to see Dr. Donell Beers yesterday and drains were pulled.  She was instructed to call here is she noted "sloshing" fluid, which she has noted today.  Reassured pt that some fluid accumulation was expected and her body will reabsorb it over time.  If the fluid causes her chest to become hard or painful, she should call back.  She understands and will comply.

## 2012-01-24 ENCOUNTER — Encounter (INDEPENDENT_AMBULATORY_CARE_PROVIDER_SITE_OTHER): Payer: BC Managed Care – PPO | Admitting: General Surgery

## 2012-01-31 ENCOUNTER — Encounter (INDEPENDENT_AMBULATORY_CARE_PROVIDER_SITE_OTHER): Payer: Self-pay | Admitting: General Surgery

## 2012-01-31 ENCOUNTER — Ambulatory Visit (INDEPENDENT_AMBULATORY_CARE_PROVIDER_SITE_OTHER): Payer: BC Managed Care – PPO | Admitting: General Surgery

## 2012-01-31 VITALS — BP 116/74 | HR 98 | Temp 100.0°F | Resp 16 | Ht 65.0 in | Wt 220.2 lb

## 2012-01-31 DIAGNOSIS — Z803 Family history of malignant neoplasm of breast: Secondary | ICD-10-CM

## 2012-01-31 NOTE — Progress Notes (Signed)
HISTORY: Patient is now around one month status post bilateral mastectomies for strong family history of breast cancer. She had to be readmitted postoperatively by the medicine team for a community acquired pneumonia. She has asthma and she was in the hospital for 5 days. Her drains were pulled prior to her discharge. She comes in for her postoperative check with a complaint of a knot in the inferior left breast flap. She also states that both sides feel "sloshy." She denies fevers and chills. Her breathing has remained better.    EXAM: General:  Alert and oriented times 3. Incision:  Small bilateral seromas.  90 mL aspirated from right side and 40 mL from left side.  There is a 1 cm firm spot in the inferolateral flap on the left.     PATHOLOGY: Benign fibrocystic changes and fibroadenoma.     ASSESSMENT AND PLAN:   Family history of breast cancer S/p bilateral mastectomies. Aspirated bilateral seromas. Small knot on left side, reexamine on next visti.       Maudry Diego, MD Surgical Oncology, General & Endocrine Surgery [facility]  Provider Not In System No ref. provider found

## 2012-01-31 NOTE — Patient Instructions (Signed)
Dry dressing as needed over seroma aspiration sites.  Call if these reaccumulate.  Follow up in 2-4 weeks.

## 2012-01-31 NOTE — Assessment & Plan Note (Signed)
S/p bilateral mastectomies. Aspirated bilateral seromas. Small knot on left side, reexamine on next visti.

## 2012-02-10 ENCOUNTER — Encounter (INDEPENDENT_AMBULATORY_CARE_PROVIDER_SITE_OTHER): Payer: Self-pay | Admitting: General Surgery

## 2012-02-10 ENCOUNTER — Ambulatory Visit
Admission: RE | Admit: 2012-02-10 | Discharge: 2012-02-10 | Disposition: A | Discharge status: Home / Self Care | Payer: BC Managed Care – PPO | Source: Ambulatory Visit | Attending: General Surgery | Admitting: General Surgery

## 2012-02-10 ENCOUNTER — Ambulatory Visit (INDEPENDENT_AMBULATORY_CARE_PROVIDER_SITE_OTHER): Payer: BC Managed Care – PPO | Admitting: General Surgery

## 2012-02-10 VITALS — BP 124/75 | HR 93 | Temp 97.2°F | Ht 61.5 in | Wt 220.8 lb

## 2012-02-10 DIAGNOSIS — Z803 Family history of malignant neoplasm of breast: Secondary | ICD-10-CM

## 2012-02-10 DIAGNOSIS — R0781 Pleurodynia: Secondary | ICD-10-CM

## 2012-02-10 DIAGNOSIS — R071 Chest pain on breathing: Secondary | ICD-10-CM

## 2012-02-10 HISTORY — DX: Pleurodynia: R07.81

## 2012-02-10 MED ORDER — IOHEXOL 300 MG/ML  SOLN
75.0000 mL | Freq: Once | INTRAMUSCULAR | Status: AC | PRN
  Administered 2012-02-10: 75 mL via INTRAVENOUS

## 2012-02-10 NOTE — Assessment & Plan Note (Signed)
Right seroma recurrent. Small left seroma. Will drain right side and follow up left side.

## 2012-02-10 NOTE — Assessment & Plan Note (Signed)
Pt with recurrent pleuritic pain after having pneumonia.  This was not seen on CXR previously. Will order chest CT.

## 2012-02-10 NOTE — Patient Instructions (Signed)
Call if seroma recurs before follow up appt.  Continue taking ibuprofen for discomfort.

## 2012-02-10 NOTE — Progress Notes (Signed)
HISTORY: S/p bilateral mastectomies for strong family history of breast cancer.   Had seromas aspirated 2 weeks ago.      EXAM: General:  Alert and oriented, looks slightly more fatigued Incision:  No sign of erythema.  Small bilateral seromas, right greater than left.  Right aspirated for 55 mL (90 previously)  Left too small to drain.   ASSESSMENT AND PLAN:   Family history of breast cancer Right seroma recurrent. Small left seroma. Will drain right side and follow up left side.    Pleuritic chest pain Pt with recurrent pleuritic pain after having pneumonia.  This was not seen on CXR previously. Will order chest CT.     Maudry Diego, MD Surgical Oncology, General & Endocrine Surgery [facility]  Provider Not In System No ref. provider found

## 2012-02-11 ENCOUNTER — Telehealth (INDEPENDENT_AMBULATORY_CARE_PROVIDER_SITE_OTHER): Payer: Self-pay

## 2012-02-11 NOTE — Telephone Encounter (Signed)
Called pt with normal chest CT results.

## 2012-02-20 ENCOUNTER — Telehealth (INDEPENDENT_AMBULATORY_CARE_PROVIDER_SITE_OTHER): Payer: Self-pay | Admitting: General Surgery

## 2012-02-20 NOTE — Telephone Encounter (Signed)
Called pt and went over CT results again.  [place] her seroma is getting larger.  I scheduled her to come in on 02/24/12 to see Dr. Donell Beers.

## 2012-02-24 ENCOUNTER — Encounter (INDEPENDENT_AMBULATORY_CARE_PROVIDER_SITE_OTHER): Payer: Self-pay | Admitting: General Surgery

## 2012-02-24 ENCOUNTER — Ambulatory Visit (INDEPENDENT_AMBULATORY_CARE_PROVIDER_SITE_OTHER): Payer: BC Managed Care – PPO | Admitting: General Surgery

## 2012-02-24 VITALS — BP 138/82 | HR 86 | Temp 98.9°F | Resp 16 | Ht 61.0 in | Wt 223.4 lb

## 2012-02-24 DIAGNOSIS — Z803 Family history of malignant neoplasm of breast: Secondary | ICD-10-CM

## 2012-02-24 NOTE — Progress Notes (Signed)
HISTORY: S/p bilateral mastectomies for strong family history of breast cancer.  Recurrent seromas have required drainage.  Some fluid accumulation present.    EXAM: General:  A&O.  NAD Incision:  Well healed without signs of infection.  50 mL aspirated from right side, 35 mL from left side.     ASSESSMENT AND PLAN:   Family history of breast cancer Seroma drained bilaterally.  Follow up in 3-4 weeks or earlier if recurrent seromas.         Tara Diego, MD Surgical Oncology, General & Endocrine Surgery [facility]  Provider Not In System No ref. provider found

## 2012-02-24 NOTE — Assessment & Plan Note (Signed)
Seroma drained bilaterally.  Follow up in 3-4 weeks or earlier if recurrent seromas.

## 2012-02-24 NOTE — Patient Instructions (Signed)
Follow up as scheduled unless expanding seromas present.

## 2012-03-27 ENCOUNTER — Other Ambulatory Visit: Payer: BC Managed Care – PPO

## 2012-03-27 ENCOUNTER — Ambulatory Visit (INDEPENDENT_AMBULATORY_CARE_PROVIDER_SITE_OTHER): Payer: BC Managed Care – PPO | Admitting: General Surgery

## 2012-03-27 VITALS — BP 124/80 | HR 102 | Temp 98.2°F | Resp 14 | Ht 61.5 in | Wt 223.2 lb

## 2012-03-27 DIAGNOSIS — Z803 Family history of malignant neoplasm of breast: Secondary | ICD-10-CM

## 2012-03-27 DIAGNOSIS — C50919 Malignant neoplasm of unspecified site of unspecified female breast: Secondary | ICD-10-CM

## 2012-03-27 NOTE — Progress Notes (Signed)
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 She had postoperative pneumonia. She has had multiple seromas that had to be drained. She has continued to have a sore spot in the lower left flap of her mastectomy. She tried 2 weeks around-the-clock ibuprofen and this did not improve things. She mostly has soreness when she tries to wear her prostheses. She also has had build up of additional fluid.   EXAM: General:  Alert and oriented times 3  Incision:  Well healed.  Very small amount of fluid build up.  Continues to have a knot/thickened area at 4 oclock.    ASSESSMENT AND PLAN:   Family history of breast cancer Pt continues to have left lower chest wall pain with painful nodule.   Plan CT to evaluate. Very small seroma present bilaterally.    Will determine follow up after scan.        Maudry Diego, MD Surgical Oncology, General & Endocrine Surgery [facility]  Provider Not In System No ref. provider found

## 2012-03-27 NOTE — Assessment & Plan Note (Signed)
Pt continues to have left lower chest wall pain with painful nodule.   Plan CT to evaluate. Very small seroma present bilaterally.    Will determine follow up after scan.

## 2012-04-02 ENCOUNTER — Ambulatory Visit
Admission: RE | Admit: 2012-04-02 | Discharge: 2012-04-02 | Disposition: A | Discharge status: Home / Self Care | Payer: BC Managed Care – PPO | Source: Ambulatory Visit | Attending: General Surgery | Admitting: General Surgery

## 2012-04-02 DIAGNOSIS — C50919 Malignant neoplasm of unspecified site of unspecified female breast: Secondary | ICD-10-CM

## 2012-04-02 MED ORDER — IOHEXOL 300 MG/ML  SOLN
75.0000 mL | Freq: Once | INTRAMUSCULAR | Status: AC | PRN
  Administered 2012-04-02: 75 mL via INTRAVENOUS

## 2012-04-06 ENCOUNTER — Telehealth (INDEPENDENT_AMBULATORY_CARE_PROVIDER_SITE_OTHER): Payer: Self-pay | Admitting: General Surgery

## 2012-04-06 NOTE — Telephone Encounter (Signed)
Patient had CT on 04/02/12. She is calling for results. I made her aware I would get a message to her assistant and we would call as soon as Dr Donell Beers has reviewed it. Please call patient.

## 2012-04-10 ENCOUNTER — Telehealth (INDEPENDENT_AMBULATORY_CARE_PROVIDER_SITE_OTHER): Payer: Self-pay | Admitting: General Surgery

## 2012-04-10 NOTE — Telephone Encounter (Signed)
Pt calling to let Dr. Donell Beers know the knot under her Lt arm is getting larger and more painful.  She is aware the CT showed "pockets of fluid," but is not sure it it addressed this knot.  Does she need U/S ?  Explained that Dr. Donell Beers is not available today; she understands.

## 2012-04-14 ENCOUNTER — Telehealth (INDEPENDENT_AMBULATORY_CARE_PROVIDER_SITE_OTHER): Payer: Self-pay | Admitting: General Surgery

## 2012-04-14 NOTE — Telephone Encounter (Signed)
Pt calling in to discuss the knot under her Lt arm.  It has gone from uncomfortable to painful now.  The knot has grown in size since last seen by Dr. Donell Beers.  She cannot raise her arm or turn over in bed without shooting pain from it.  Pt has been taking Aleve without much relief and her appt with [facility] is not for another 2 weeks.  As Dr. Donell Beers is off after call, and she is allergic to most narcotics, discussed with Dr. Johna Sheriff.   Obtained orders for Tramadol 50 mg, #30, 1-2 po Q6H prn pain, no refill and called to [facility]: [phone]

## 2012-04-27 ENCOUNTER — Ambulatory Visit (INDEPENDENT_AMBULATORY_CARE_PROVIDER_SITE_OTHER): Payer: BC Managed Care – PPO | Admitting: General Surgery

## 2012-04-27 ENCOUNTER — Encounter (INDEPENDENT_AMBULATORY_CARE_PROVIDER_SITE_OTHER): Payer: Self-pay | Admitting: General Surgery

## 2012-04-27 VITALS — BP 106/68 | HR 84 | Temp 99.2°F | Resp 18 | Ht 61.5 in | Wt 224.2 lb

## 2012-04-27 DIAGNOSIS — M609 Myositis, unspecified: Secondary | ICD-10-CM

## 2012-04-27 DIAGNOSIS — IMO0001 Reserved for inherently not codable concepts without codable children: Secondary | ICD-10-CM

## 2012-04-27 MED ORDER — AMOXICILLIN-POT CLAVULANATE 875-125 MG PO TABS
1.0000 | ORAL_TABLET | Freq: Two times a day (BID) | ORAL | Status: AC
Start: 2012-04-27 — End: 2012-05-11

## 2012-04-27 NOTE — Patient Instructions (Addendum)
Take antibiotic, follow up in 1 week.

## 2012-04-27 NOTE — Progress Notes (Signed)
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 She had postoperative pneumonia. She has had multiple seromas that had to be drained. She has continued to have a sore spot in the lower left flap of her mastectomy. She tried 2 weeks around-the-clock ibuprofen and this did not improve things. She had minimal residual seroma last time I saw her.   The sore spot remained sore.  She is now feeling much worse with pain over her entire left chest and is having low grade fevers.  She is taking ibuprofen around the clock and tramadol at night without relief.  She has pain when she moves her left arm.    EXAM: General:  Alert and oriented times 3  Incision:  Incision is well healed.  Less fluid than previous visit.  No erythema.  Tenderness across entire pectoralis muscle to clavicle and humerus at insertion.  Skin of left chest is warm.   Continues to have a knot/thickened area at 4 oclock but this is much less prominent.      ASSESSMENT AND PLAN:   Myositis Pt with significant soreness and warmth over entire left pectoralis muscle in association with fevers. Will try course of antibiotics and follow up in 1 week.     Maudry Diego, MD Surgical Oncology, General & Endocrine Surgery [facility]  Provider Not In System No ref. provider found

## 2012-04-27 NOTE — Assessment & Plan Note (Signed)
Pt with significant soreness and warmth over entire left pectoralis muscle in association with fevers. Will try course of antibiotics and follow up in 1 week.

## 2012-04-30 ENCOUNTER — Encounter (INDEPENDENT_AMBULATORY_CARE_PROVIDER_SITE_OTHER): Payer: Self-pay | Admitting: General Surgery

## 2012-04-30 ENCOUNTER — Ambulatory Visit (INDEPENDENT_AMBULATORY_CARE_PROVIDER_SITE_OTHER): Payer: BC Managed Care – PPO | Admitting: General Surgery

## 2012-04-30 VITALS — BP 118/88 | HR 96 | Temp 98.2°F | Resp 18 | Ht 61.5 in | Wt 221.6 lb

## 2012-04-30 DIAGNOSIS — IMO0002 Reserved for concepts with insufficient information to code with codable children: Secondary | ICD-10-CM

## 2012-04-30 HISTORY — DX: Reserved for concepts with insufficient information to code with codable children: IMO0002

## 2012-04-30 NOTE — Progress Notes (Signed)
The patient comes in with complaints of pain in her bilateral chest wall and axillary area along with increased swelling. She was last seen by Dr. Ulanda Edison on Monday. At that time she was placed on Augmentin for possible mastectomy site infection.  On examination today the patient has minimal redness. In her left axilla primarily and also slightly in her right axilla she does have increased fluid collections inferior laterally. These are not tense and although she complains of significant discomfort when the left side is palpated I do not feel as though it is infected and requires drainage.  It seems as though the flaps had developed seromas initially on the midportion of her chest but now that has moved laterally but causes seems as though the medial parts of the flaps are beginning to inferior down. If the patient needs drainage and should probably be done by her primary surgeon. I told her that she should keep her appointment to see Dr. Donell Beers next week.

## 2012-05-04 ENCOUNTER — Ambulatory Visit (INDEPENDENT_AMBULATORY_CARE_PROVIDER_SITE_OTHER): Payer: BC Managed Care – PPO | Admitting: General Surgery

## 2012-05-04 ENCOUNTER — Encounter (INDEPENDENT_AMBULATORY_CARE_PROVIDER_SITE_OTHER): Payer: Self-pay | Admitting: General Surgery

## 2012-05-04 VITALS — BP 106/70 | HR 84 | Temp 98.0°F | Resp 18 | Ht 61.5 in | Wt 225.2 lb

## 2012-05-04 DIAGNOSIS — R0789 Other chest pain: Secondary | ICD-10-CM | POA: Insufficient documentation

## 2012-05-04 DIAGNOSIS — R071 Chest pain on breathing: Secondary | ICD-10-CM

## 2012-05-04 DIAGNOSIS — G8929 Other chronic pain: Secondary | ICD-10-CM

## 2012-05-04 MED ORDER — GABAPENTIN 100 MG PO CAPS: 200.0000 mg | ORAL_CAPSULE | Freq: Three times a day (TID) | ORAL | Status: DC

## 2012-05-04 NOTE — Patient Instructions (Signed)
Start neurontin 300 mg (3 capsules) po at bedtime.  If no improvement after 5 days, add in 100 at breakfast and 100 at lunch.  If no improvement after another 5 days, increase bedtime dose to 400 mg.  If no improvement or minimal improvement, call back.  Go to physical therapy.  ABC class or other.

## 2012-05-04 NOTE — Progress Notes (Signed)
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 She had postoperative pneumonia. She has had multiple seromas that had to be drained. She has continued to have a sore spot in the lower left flap of her mastectomy. She tried 2 weeks around-the-clock ibuprofen and this did not improve things. She had minimal residual seroma by exam and by CT.  The most sore spot now is at the anterior axillary line.  When I saw her last week, she had increased soreness of the entire pectoralis muscle and swelling along the left pectoralis chest wall.  I prescribed Augmentin.  She was not getting better, so she saw Dr. Lindie Spruce last week in urgent office for continued pain.    EXAM: General:  Alert and oriented times 3  Incision:  Incision is well healed.  Continues to have decreased seroma. No erythema.  Tenderness across entire pectoralis muscle to clavicle and humerus at insertion.  Skin of left chest is still slightly warm.   Knot at 4 o'clock continues to decrease in size.  Very tender at anterior axillary line.    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    If this does not work, I would refer to chronic pain clinic.  I postu   Maudry Diego, MD Surgical Oncology, General & Endocrine Surgery [facility]  Provider Not In System No ref. provider found

## 2012-05-05 ENCOUNTER — Telehealth (INDEPENDENT_AMBULATORY_CARE_PROVIDER_SITE_OTHER): Payer: Self-pay | Admitting: General Surgery

## 2012-05-05 NOTE — Telephone Encounter (Signed)
Pt went to pick up her new medicine and pharmacy did not have it.  Per notes in chart, called in Gabapentin 100 mg caps, # 90, 2 po BID, 2 refills to [facility]:  [phone].  Pt aware.

## 2013-03-01 ENCOUNTER — Ambulatory Visit (INDEPENDENT_AMBULATORY_CARE_PROVIDER_SITE_OTHER): Payer: PRIVATE HEALTH INSURANCE | Admitting: General Surgery

## 2013-03-01 ENCOUNTER — Encounter (INDEPENDENT_AMBULATORY_CARE_PROVIDER_SITE_OTHER): Payer: Self-pay | Admitting: General Surgery

## 2013-03-01 VITALS — BP 120/84 | HR 88 | Temp 99.2°F | Resp 18 | Ht 61.0 in | Wt 232.0 lb

## 2013-03-01 DIAGNOSIS — R222 Localized swelling, mass and lump, trunk: Secondary | ICD-10-CM

## 2013-03-01 HISTORY — DX: Localized swelling, mass and lump, trunk: R22.2

## 2013-03-01 NOTE — Assessment & Plan Note (Signed)
Patient has a painful knot in her lower left anterior axillary line.  Plan resection of this.  Patient is at risk for bleeding, infection, damage to adjacent structures she, continued pain.  Given the fact the patient did not have any cancer previously, I would not do any imaging.  We will plan to do this after her children are out of school.

## 2013-03-01 NOTE — Patient Instructions (Signed)
Risks are similar to mastectomy, bleeding, infection, chronic pain.  Will probably not leave a drain unless the cavity is large.

## 2013-03-01 NOTE — Progress Notes (Signed)
HISTORY: Patient is a [age] female who is now approximately a year status post bilateral mastectomies for high risk of breast cancer and numerous fibroadenomas. She felt a knot in her anterior axillary line last year that felt like scar tissue. This softened up significantly and we planned to follow it up. It got significantly better but now started hurting again.  She feels that the area has become more defined again. It hurts every time one of her children bumps it.  She denies fevers and chills. She is taking Lupron injections because of uterine issues. This has made her hair fall out.   PERTINENT REVIEW OF SYSTEMS: Otherwise negative x 11  Filed Vitals:   03/01/13 1046  BP: 120/84  Pulse: 88  Temp: 99.2 F (37.3 C)  Resp: 18   Filed Weights   03/01/13 1046  Weight: 232 lb (105.235 kg)   EXAM: Head: Normocephalic and atraumatic.  Eyes:  Conjunctivae are normal. Pupils are equal, round, and reactive to light. No scleral icterus.  Neck:  Normal range of motion. Neck supple. No tracheal deviation present. No thyromegaly present.  Chest wall:  Left chest wall with some thickening in anterior axillary line.  2-3 cm.  Mobile.  Tender.  Mastectomy flaps are soft.  Resp: No respiratory distress, normal effort. Abd:  Abdomen is soft, non distended and non tender. No masses are palpable.  There is no rebound and no guarding.  Neurological: Alert and oriented to person, place, and time. Coordination normal.  Skin: Skin is warm and dry. No rash noted. No diaphoretic. No erythema. No pallor.  Psychiatric: Normal mood and affect. Normal behavior. Judgment and thought content normal.      ASSESSMENT AND PLAN:   Chest wall mass Patient has a painful knot in her lower left anterior axillary line.  Plan resection of this.  Patient is at risk for bleeding, infection, damage to adjacent structures she, continued pain.  Given the fact the patient did not have any cancer previously, I would not  do any imaging.  We will plan to do this after her children are out of school.      Lavin Petteway L Naijah Lacek, MD Surgical Oncology, General & Endocrine Surgery [facility]  Provider Not In System No ref. provider found   

## 2013-03-17 ENCOUNTER — Encounter (HOSPITAL_BASED_OUTPATIENT_CLINIC_OR_DEPARTMENT_OTHER): Payer: Self-pay | Admitting: *Deleted

## 2013-03-24 ENCOUNTER — Ambulatory Visit (HOSPITAL_BASED_OUTPATIENT_CLINIC_OR_DEPARTMENT_OTHER): Payer: PRIVATE HEALTH INSURANCE | Admitting: Anesthesiology

## 2013-03-24 ENCOUNTER — Encounter (HOSPITAL_BASED_OUTPATIENT_CLINIC_OR_DEPARTMENT_OTHER): Payer: Self-pay | Admitting: *Deleted

## 2013-03-24 ENCOUNTER — Ambulatory Visit (HOSPITAL_BASED_OUTPATIENT_CLINIC_OR_DEPARTMENT_OTHER)
Admission: RE | Admit: 2013-03-24 | Discharge: 2013-03-24 | Disposition: A | Discharge status: Home / Self Care | Payer: PRIVATE HEALTH INSURANCE | Source: Ambulatory Visit | Attending: General Surgery | Admitting: General Surgery

## 2013-03-24 ENCOUNTER — Encounter (HOSPITAL_BASED_OUTPATIENT_CLINIC_OR_DEPARTMENT_OTHER)
Admission: RE | Disposition: A | Discharge status: Home / Self Care | Payer: Self-pay | Source: Ambulatory Visit | Attending: General Surgery

## 2013-03-24 ENCOUNTER — Encounter (HOSPITAL_BASED_OUTPATIENT_CLINIC_OR_DEPARTMENT_OTHER): Payer: Self-pay | Admitting: Anesthesiology

## 2013-03-24 DIAGNOSIS — D1739 Benign lipomatous neoplasm of skin and subcutaneous tissue of other sites: Secondary | ICD-10-CM

## 2013-03-24 DIAGNOSIS — Z853 Personal history of malignant neoplasm of breast: Secondary | ICD-10-CM | POA: Insufficient documentation

## 2013-03-24 DIAGNOSIS — Z9010 Acquired absence of unspecified breast and nipple: Secondary | ICD-10-CM | POA: Insufficient documentation

## 2013-03-24 HISTORY — PX: MASS EXCISION: SHX2000

## 2013-03-24 SURGERY — EXCISION MASS
Anesthesia: General | Site: Chest | Laterality: Left | Wound class: Clean

## 2013-03-24 MED ORDER — FENTANYL CITRATE 0.05 MG/ML IJ SOLN
50.0000 ug | INTRAMUSCULAR | Status: DC | PRN
Start: 2013-03-24 — End: 2013-03-24

## 2013-03-24 MED ORDER — CEFAZOLIN SODIUM-DEXTROSE 2-3 GM-% IV SOLR
2.0000 g | INTRAVENOUS | Status: AC
  Administered 2013-03-24: 2 g via INTRAVENOUS

## 2013-03-24 MED ORDER — TRAMADOL HCL 50 MG PO TABS: 50.0000 mg | ORAL_TABLET | Freq: Four times a day (QID) | ORAL | Status: DC | PRN

## 2013-03-24 MED ORDER — PROPOFOL 10 MG/ML IV BOLUS
INTRAVENOUS | Status: DC | PRN
  Administered 2013-03-24: 150 mg via INTRAVENOUS
  Administered 2013-03-24: 50 mg via INTRAVENOUS

## 2013-03-24 MED ORDER — DEXAMETHASONE SODIUM PHOSPHATE 4 MG/ML IJ SOLN
INTRAMUSCULAR | Status: DC | PRN
  Administered 2013-03-24: 10 mg via INTRAVENOUS

## 2013-03-24 MED ORDER — MIDAZOLAM HCL 2 MG/2ML IJ SOLN: 1.0000 mg | INTRAMUSCULAR | Status: DC | PRN

## 2013-03-24 MED ORDER — FENTANYL CITRATE 0.05 MG/ML IJ SOLN
INTRAMUSCULAR | Status: DC | PRN
  Administered 2013-03-24 (×2): 50 ug via INTRAVENOUS

## 2013-03-24 MED ORDER — KETOROLAC TROMETHAMINE 30 MG/ML IJ SOLN
INTRAMUSCULAR | Status: DC | PRN
  Administered 2013-03-24: 30 mg via INTRAVENOUS

## 2013-03-24 MED ORDER — ACETAMINOPHEN 10 MG/ML IV SOLN
1000.0000 mg | Freq: Once | INTRAVENOUS | Status: AC
  Administered 2013-03-24: 1000 mg via INTRAVENOUS

## 2013-03-24 MED ORDER — LIDOCAINE HCL (CARDIAC) 20 MG/ML IV SOLN
INTRAVENOUS | Status: DC | PRN
  Administered 2013-03-24: 80 mg via INTRAVENOUS

## 2013-03-24 MED ORDER — SCOPOLAMINE 1 MG/3DAYS TD PT72
1.0000 | MEDICATED_PATCH | Freq: Once | TRANSDERMAL | Status: DC
  Administered 2013-03-24: 1.5 mg via TRANSDERMAL

## 2013-03-24 MED ORDER — LACTATED RINGERS IV SOLN
INTRAVENOUS | Status: DC
  Administered 2013-03-24 (×2): via INTRAVENOUS

## 2013-03-24 MED ORDER — BUPIVACAINE HCL (PF) 0.25 % IJ SOLN
INTRAMUSCULAR | Status: DC | PRN
  Administered 2013-03-24: 20 mL

## 2013-03-24 MED ORDER — ONDANSETRON HCL 4 MG/2ML IJ SOLN
INTRAMUSCULAR | Status: DC | PRN
  Administered 2013-03-24: 4 mg via INTRAVENOUS

## 2013-03-24 MED ORDER — METOCLOPRAMIDE HCL 5 MG/ML IJ SOLN
10.0000 mg | Freq: Once | INTRAMUSCULAR | Status: AC | PRN
  Administered 2013-03-24: 10 mg via INTRAVENOUS

## 2013-03-24 MED ORDER — MIDAZOLAM HCL 5 MG/5ML IJ SOLN
INTRAMUSCULAR | Status: DC | PRN
  Administered 2013-03-24: 2 mg via INTRAVENOUS

## 2013-03-24 MED ORDER — FENTANYL CITRATE 0.05 MG/ML IJ SOLN
25.0000 ug | INTRAMUSCULAR | Status: DC | PRN
  Administered 2013-03-24 (×2): 50 ug via INTRAVENOUS

## 2013-03-24 MED ORDER — TRAMADOL HCL 50 MG PO TABS
50.0000 mg | ORAL_TABLET | Freq: Four times a day (QID) | ORAL | Status: DC | PRN
  Administered 2013-03-24: 50 mg via ORAL

## 2013-03-24 SURGICAL SUPPLY — 51 items
ADH SKN CLS APL DERMABOND .7 (GAUZE/BANDAGES/DRESSINGS) ×1
BLADE HEX COATED 2.75 (ELECTRODE) ×2 IMPLANT
BLADE SURG 10 STRL SS (BLADE) ×1 IMPLANT
BLADE SURG 15 STRL LF DISP TIS (BLADE) ×1 IMPLANT
BLADE SURG 15 STRL SS (BLADE) ×2
CANISTER SUCTION 1200CC (MISCELLANEOUS) ×1 IMPLANT
CHLORAPREP W/TINT 26ML (MISCELLANEOUS) ×2 IMPLANT
CLIP TI MEDIUM 6 (CLIP) ×2 IMPLANT
CLOTH BEACON ORANGE TIMEOUT ST (SAFETY) ×2 IMPLANT
COVER MAYO STAND STRL (DRAPES) ×2 IMPLANT
COVER TABLE BACK 60X90 (DRAPES) ×2 IMPLANT
DECANTER SPIKE VIAL GLASS SM (MISCELLANEOUS) IMPLANT
DERMABOND ADVANCED (GAUZE/BANDAGES/DRESSINGS) ×1
DERMABOND ADVANCED .7 DNX12 (GAUZE/BANDAGES/DRESSINGS) ×1 IMPLANT
DRAIN CHANNEL 19F RND (DRAIN) ×1 IMPLANT
DRAPE PED LAPAROTOMY (DRAPES) ×2 IMPLANT
DRAPE UTILITY XL STRL (DRAPES) ×2 IMPLANT
ELECT REM PT RETURN 9FT ADLT (ELECTROSURGICAL) ×2
ELECTRODE REM PT RTRN 9FT ADLT (ELECTROSURGICAL) ×1 IMPLANT
EVACUATOR SILICONE 100CC (DRAIN) ×2 IMPLANT
GAUZE SPONGE 4X4 12PLY STRL LF (GAUZE/BANDAGES/DRESSINGS) ×4 IMPLANT
GLOVE BIO SURGEON STRL SZ 6 (GLOVE) ×2 IMPLANT
GLOVE BIO SURGEON STRL SZ 6.5 (GLOVE) ×1 IMPLANT
GLOVE BIOGEL PI IND STRL 6.5 (GLOVE) ×1 IMPLANT
GLOVE BIOGEL PI INDICATOR 6.5 (GLOVE) ×1
GLOVE ECLIPSE 6.5 STRL STRAW (GLOVE) ×1 IMPLANT
GLOVE SURG SS PI 7.0 STRL IVOR (GLOVE) ×1 IMPLANT
GOWN PREVENTION PLUS XLARGE (GOWN DISPOSABLE) ×3 IMPLANT
GOWN PREVENTION PLUS XXLARGE (GOWN DISPOSABLE) ×3 IMPLANT
NDL HYPO 25X1 1.5 SAFETY (NEEDLE) ×1 IMPLANT
NEEDLE HYPO 25X1 1.5 SAFETY (NEEDLE) ×2 IMPLANT
NS IRRIG 1000ML POUR BTL (IV SOLUTION) IMPLANT
PACK BASIN DAY SURGERY FS (CUSTOM PROCEDURE TRAY) ×2 IMPLANT
PENCIL BUTTON HOLSTER BLD 10FT (ELECTRODE) ×2 IMPLANT
SLEEVE SCD COMPRESS KNEE MED (MISCELLANEOUS) ×1 IMPLANT
SPONGE LAP 18X18 X RAY DECT (DISPOSABLE) ×1 IMPLANT
SPONGE LAP 4X18 X RAY DECT (DISPOSABLE) ×2 IMPLANT
STAPLER VISISTAT 35W (STAPLE) IMPLANT
SUT ETHILON 2 0 FS 18 (SUTURE) ×1 IMPLANT
SUT MNCRL AB 4-0 PS2 18 (SUTURE) ×2 IMPLANT
SUT SILK 3 0 TIES 17X18 (SUTURE)
SUT SILK 3-0 18XBRD TIE BLK (SUTURE) IMPLANT
SUT VIC AB 3-0 SH 27 (SUTURE) ×2
SUT VIC AB 3-0 SH 27X BRD (SUTURE) ×1 IMPLANT
SUT VICRYL 3-0 CR8 SH (SUTURE) ×2 IMPLANT
SUT VICRYL 4-0 PS2 18IN ABS (SUTURE) ×2 IMPLANT
SYR CONTROL 10ML LL (SYRINGE) ×2 IMPLANT
TOWEL OR 17X24 6PK STRL BLUE (TOWEL DISPOSABLE) ×2 IMPLANT
TOWEL OR NON WOVEN STRL DISP B (DISPOSABLE) ×2 IMPLANT
TUBE CONNECTING 20X1/4 (TUBING) ×1 IMPLANT
YANKAUER SUCT BULB TIP NO VENT (SUCTIONS) ×1 IMPLANT

## 2013-03-24 NOTE — Transfer of Care (Signed)
Immediate Anesthesia Transfer of Care Note  Patient: Tara Clarke  Procedure(s) Performed: Procedure(s): EXCISION LEFT CHEST WALL MASS (Left)  Patient Location: PACU  Anesthesia Type:General  Level of Consciousness: awake and alert   Airway & Oxygen Therapy: Patient Spontanous Breathing and Patient connected to face mask oxygen  Post-op Assessment: Report given to PACU RN  Post vital signs: Reviewed and stable  Complications: No apparent anesthesia complications

## 2013-03-24 NOTE — Op Note (Signed)
Excision of soft tissue mass left chest wall   Indications: This patient presents with history of bilateral mastectomies for frequent biopsies and high risk breast cancer.  She had a painful nodule in her lower left mastectomy flap approximately 2x4 cm.  Pre-operative Diagnosis: left chest wall mass  Post-operative Diagnosis: Same  Surgeon: Almond Lint   Assistants: none  Anesthesia: General LMA anesthesia  ASA Class: 2  Procedure Details  The patient was seen in the Holding Room. The risks, benefits, complications, treatment options, and expected outcomes were discussed with the patient. The possibilities of reaction to medication, pulmonary aspiration, bleeding, infection, the need for additional procedures, failure to diagnose a condition, and creating a complication requiring additional invasive treatment were discussed with the patient. The patient concurred with the proposed plan, giving informed consent.  The site of surgery properly noted/marked. The patient was taken to Operating Room # 3, identified as Tara Clarke and the procedure verified as excision of left chest wall mass. A Time Out was held and the above information confirmed.  After induction of anesthesia, the left chest was prepped and draped in standard fashion. The prior mastectomy incision was opened and a skin flap was created inferiorly.  The firm area was excised with some additional fatty tissue using the cautery.  The lateral pectoralis border was identifies and the latissimus.  Clips were used on vascular channels.  A 19 Fr Blake drain was placed in the lower area and secured with a 2-0 Nylon.   Hemostasis was achieved with cautery.  The wound was irrigated and closed with a 3-0 Vicryl deep dermal interrupted and a 4-0 Monocryl subcuticular closure in layers.    Sterile dressings were applied. At the end of the operation, all sponge, instrument, and needle counts were correct.  Findings: grossly clear  surgical margins  Estimated Blood Loss:  Minimal         Drains: 19 Fr Blake         Specimens: left chest wall mass                Complications:  None; patient tolerated the procedure well.         Disposition: PACU - hemodynamically stable.         Condition: stable

## 2013-03-24 NOTE — Anesthesia Procedure Notes (Signed)
Procedure Name: LMA Insertion Date/Time: 03/24/2013 10:58 AM Performed by: Gar Gibbon Pre-anesthesia Checklist: Patient identified, Emergency Drugs available, Suction available and Patient being monitored Patient Re-evaluated:Patient Re-evaluated prior to inductionOxygen Delivery Method: Circle System Utilized Preoxygenation: Pre-oxygenation with 100% oxygen Intubation Type: IV induction Ventilation: Mask ventilation without difficulty LMA: LMA inserted LMA Size: 4.0 Number of attempts: 1 Airway Equipment and Method: bite block Placement Confirmation: positive ETCO2 Tube secured with: Tape Dental Injury: Teeth and Oropharynx as per pre-operative assessment

## 2013-03-24 NOTE — Anesthesia Preprocedure Evaluation (Signed)
Anesthesia Evaluation  Patient identified by MRN, date of birth, ID band Patient awake    Reviewed: Allergy & Precautions, H&P , NPO status , Patient's Chart, lab work & pertinent test results, reviewed documented beta blocker date and time   History of Anesthesia Complications (+) PONV  Airway Mallampati: II TM Distance: >3 FB Neck ROM: full    Dental   Pulmonary asthma , pneumonia -, resolved,  breath sounds clear to auscultation        Cardiovascular negative cardio ROS  Rhythm:regular     Neuro/Psych  Headaches, PSYCHIATRIC DISORDERS  Neuromuscular disease negative psych ROS   GI/Hepatic negative GI ROS, Neg liver ROS,   Endo/Other  negative endocrine ROSMorbid obesity  Renal/GU negative Renal ROS  negative genitourinary   Musculoskeletal   Abdominal   Peds  Hematology negative hematology ROS (+) Blood dyscrasia, ,   Anesthesia Other Findings See surgeon's H&P   Reproductive/Obstetrics negative OB ROS                           Anesthesia Physical Anesthesia Plan  ASA: III  Anesthesia Plan: General   Post-op Pain Management:    Induction: Intravenous  Airway Management Planned: LMA  Additional Equipment:   Intra-op Plan:   Post-operative Plan:   Informed Consent: I have reviewed the patients History and Physical, chart, labs and discussed the procedure including the risks, benefits and alternatives for the proposed anesthesia with the patient or authorized representative who has indicated his/her understanding and acceptance.   Dental Advisory Given  Plan Discussed with: CRNA and Surgeon  Anesthesia Plan Comments:         Anesthesia Quick Evaluation

## 2013-03-24 NOTE — Anesthesia Postprocedure Evaluation (Signed)
Anesthesia Post Note  Patient: Tara Clarke  Procedure(s) Performed: Procedure(s) (LRB): EXCISION LEFT CHEST WALL MASS (Left)  Anesthesia type: General  Patient location: PACU  Post pain: Pain level controlled  Post assessment: Patient's Cardiovascular Status Stable  Last Vitals:  Filed Vitals:   03/24/13 1500  BP: 109/66  Pulse: 109  Temp: 36.7 C  Resp: 20    Post vital signs: Reviewed and stable  Level of consciousness: alert  Complications: No apparent anesthesia complications

## 2013-03-24 NOTE — H&P (View-Only) (Signed)
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 She felt a knot in her anterior axillary line last year that felt like scar tissue. This softened up significantly and we planned to follow it up. It got significantly better but now started hurting again.  She feels that the area has become more defined again. It hurts every time one of her children bumps it.  She denies fevers and chills. She is taking Lupron injections because of uterine issues. This has made her hair fall out.   PERTINENT REVIEW OF SYSTEMS: Otherwise negative x 11  Filed Vitals:   03/01/13 1046  BP: 120/84  Pulse: 88  Temp: 99.2 F (37.3 C)  Resp: 18   Filed Weights   03/01/13 1046  Weight: 232 lb (105.235 kg)   EXAM: Head: Normocephalic and atraumatic.  Eyes:  Conjunctivae are normal. Pupils are equal, round, and reactive to light. No scleral icterus.  Neck:  Normal range of motion. Neck supple. No tracheal deviation present. No thyromegaly present.  Chest wall:  Left chest wall with some thickening in anterior axillary line.  2-3 cm.  Mobile.  Tender.  Mastectomy flaps are soft.  Resp: No respiratory distress, normal effort. Abd:  Abdomen is soft, non distended and non tender. No masses are palpable.  There is no rebound and no guarding.  Neurological: Alert and oriented to person, place, and time. Coordination normal.  Skin: Skin is warm and dry. No rash noted. No diaphoretic. No erythema. No pallor.  Psychiatric: Normal mood and affect. Normal behavior. Judgment and thought content normal.      ASSESSMENT AND PLAN:   Chest wall mass Patient has a painful knot in her lower left anterior axillary line.  Plan resection of this.  Patient is at risk for bleeding, infection, damage to adjacent structures she, continued pain.  Given the fact the patient did not have any cancer previously, I would not  do any imaging.  We will plan to do this after her children are out of school.      Maudry Diego, MD Surgical Oncology, General & Endocrine Surgery [facility]  Provider Not In System No ref. provider found

## 2013-03-24 NOTE — Interval H&P Note (Signed)
History and Physical Interval Note:  03/24/2013 10:37 AM  Tara Clarke  has presented today for surgery, with the diagnosis of painwell left chest wall mass  The various methods of treatment have been discussed with the patient and family. After consideration of risks, benefits and other options for treatment, the patient has consented to  Procedure(s): EXCISION LEFT CHEST WALL MASS (Left) as a surgical intervention .  The patient's history has been reviewed, patient examined, no change in status, stable for surgery.  I have reviewed the patient's chart and labs.  Questions were answered to the patient's satisfaction.     Hussain Maimone

## 2013-03-25 ENCOUNTER — Encounter (HOSPITAL_BASED_OUTPATIENT_CLINIC_OR_DEPARTMENT_OTHER): Payer: Self-pay | Admitting: General Surgery

## 2013-03-26 ENCOUNTER — Telehealth (INDEPENDENT_AMBULATORY_CARE_PROVIDER_SITE_OTHER): Payer: Self-pay

## 2013-03-26 NOTE — Progress Notes (Signed)
Quick Note:  Please let patient know that tissue we removed was benign. ______

## 2013-03-26 NOTE — Telephone Encounter (Signed)
Pt notified of benign pathology.  She says drain is working better today.  There were 50cc in the bulb when she woke up this morning.  I told her to call if any questions or concerns.

## 2013-03-30 ENCOUNTER — Other Ambulatory Visit (HOSPITAL_BASED_OUTPATIENT_CLINIC_OR_DEPARTMENT_OTHER): Payer: Self-pay | Admitting: General Surgery

## 2013-03-31 IMAGING — CT CT CHEST W/ CM
2 of 3 series · 15 of 36 positions shown, 18 images · IV contrast (75CC OMNI 300)
Comparison: 01/11/2012.

CLINICAL DATA: Pleuritic chest pain with occasional shortness of
breath.  Recent pneumonia.

CT CHEST WITH CONTRAST
TECHNIQUE: Multidetector CT imaging of the chest was performed
following the standard protocol during bolus administration of
intravenous contrast.
Contrast: 75mL OMNIPAQUE IOHEXOL 300 MG/ML  SOLN

[Series 2: chest with · axial · 0.82mm/px · z∈[-263,-23]mm · 12 of 58 slices shown, 15 images]
[im 5/58  mediastinal]
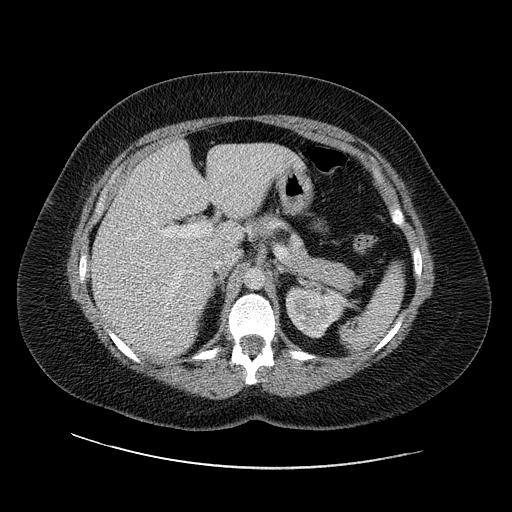
[im 5/58  lung]
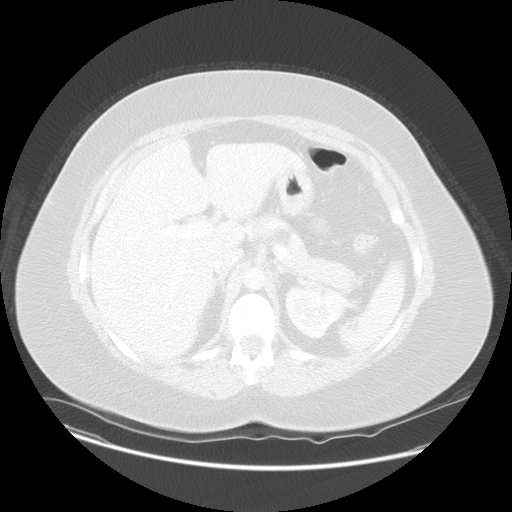
[im 9/58  lung]
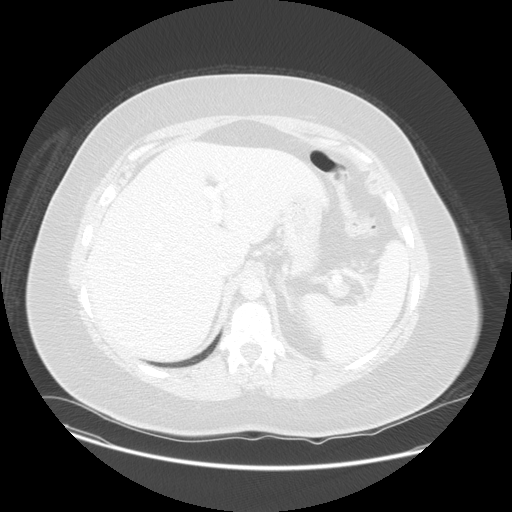
[im 13/58  lung]
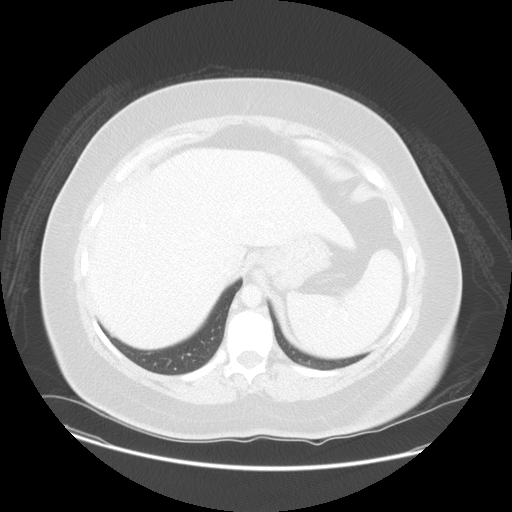
[im 17/58  lung]
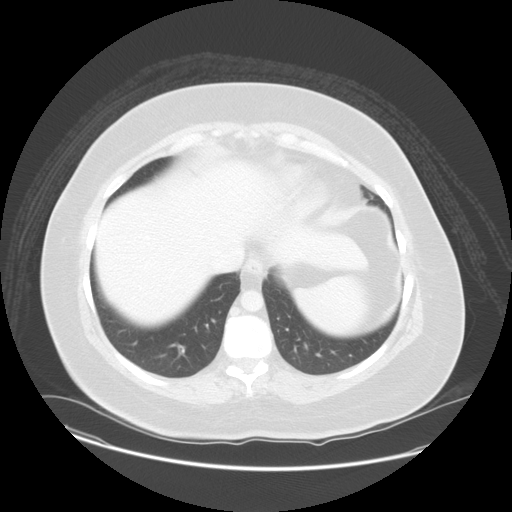
[im 22/58  mediastinal]
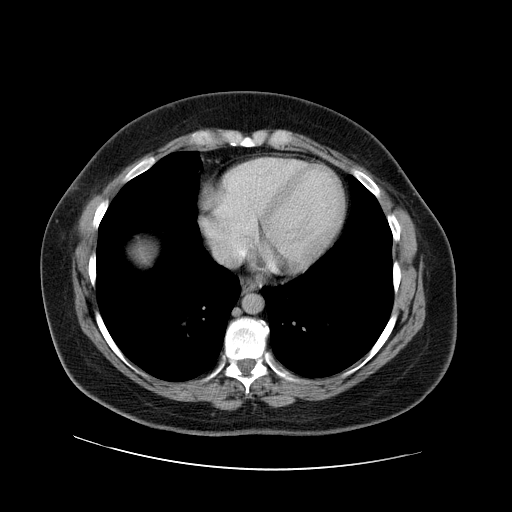
[im 22/58  lung]
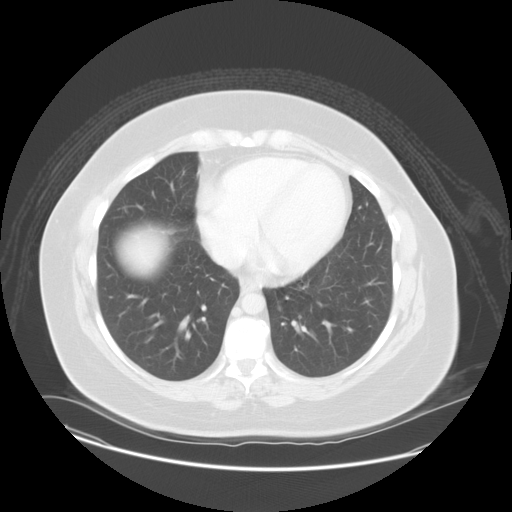
[im 26/58  lung]
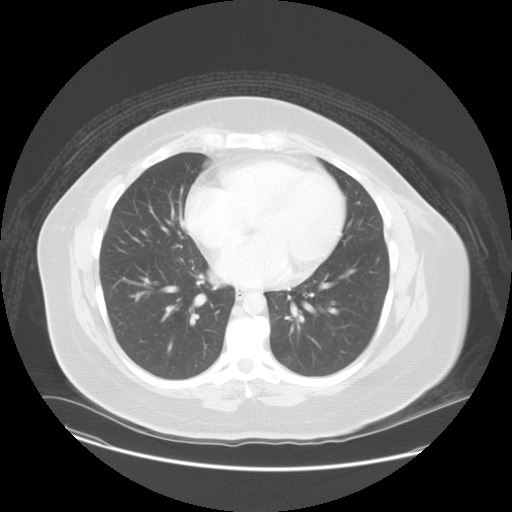
[im 32/58  lung]
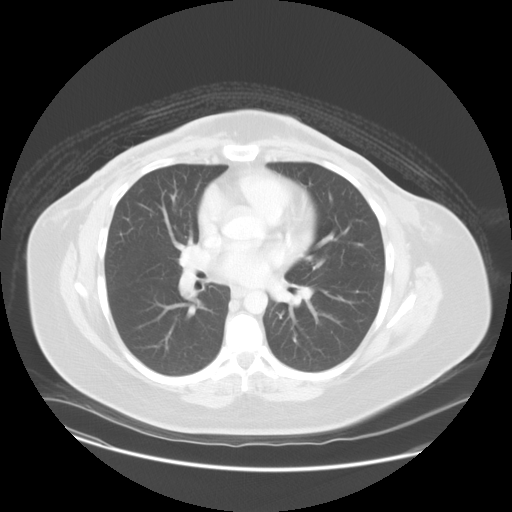
[im 36/58  lung]
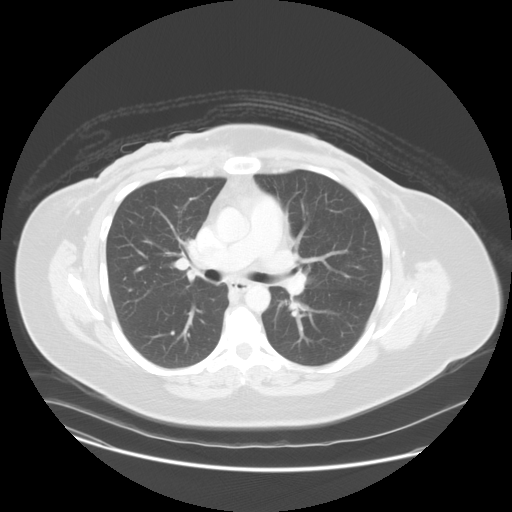
[im 41/58  mediastinal]
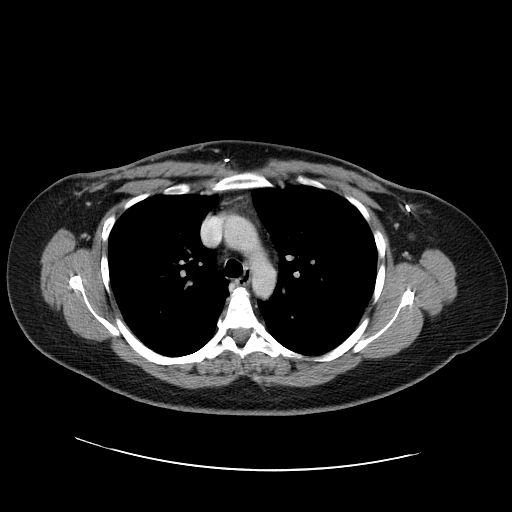
[im 41/58  lung]
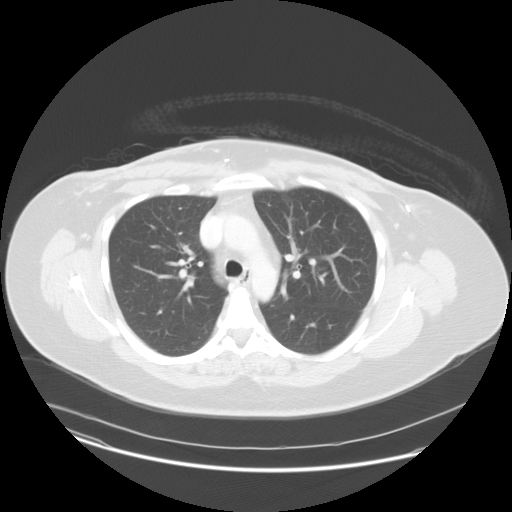
[im 45/58  lung]
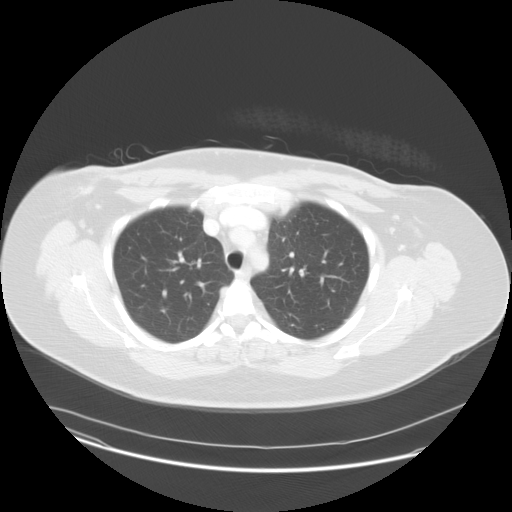
[im 49/58  lung]
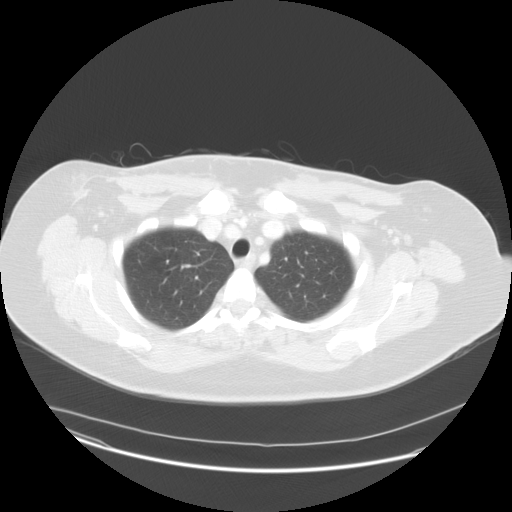
[im 53/58  lung]
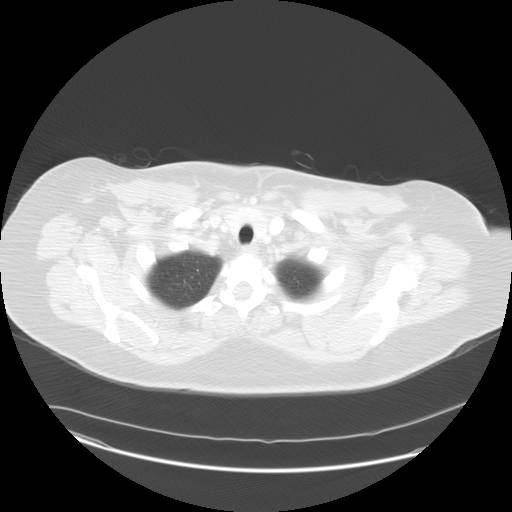

[Series 400: cor · coronal · 0.82mm/px · 3 of 135 slices shown]
[im 27/135  lung]
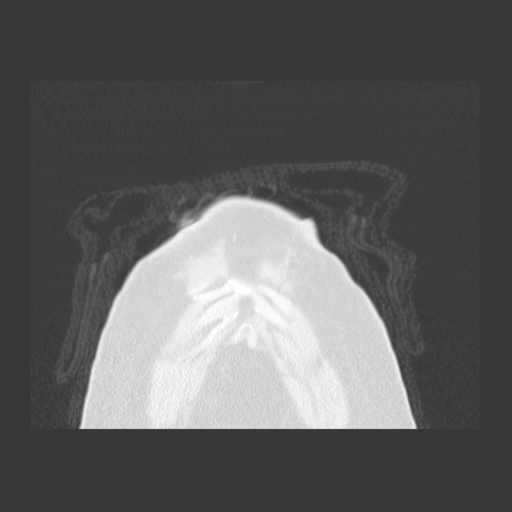
[im 54/135  lung]
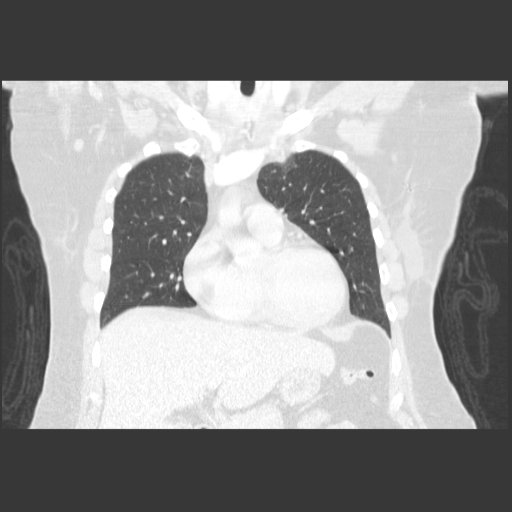
[im 81/135  lung]
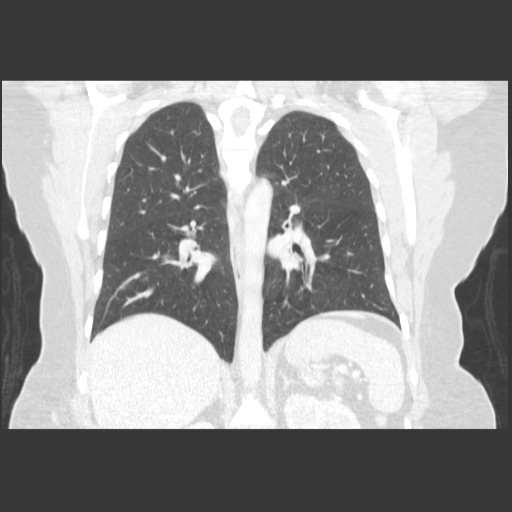

[15 of 36 positions shown; findings below may reference images not displayed]

FINDINGS: No pathologically enlarged mediastinal, hilar or axillary
lymph nodes.  There are postoperative changes in the anterior chest
wall bilaterally.  An ovoid low density collection in the lateral
left chest wall measures 1.6 x 4.4 cm..  Heart size normal.  No
pericardial effusion.

Lungs are clear.  No pleural fluid.  Airway is unremarkable.

Incidental imaging of the upper abdomen shows a vague sub
centimeter low attenuation lesion in the hepatic dome, too small to
characterize.  No worrisome lytic or sclerotic lesions.
IMPRESSION: 1.  Postoperative changes of bilateral mastectomies, with a
probable small postoperative seroma in the lateral left chest wall.
2.  No acute findings in the chest.

## 2013-04-12 ENCOUNTER — Ambulatory Visit (INDEPENDENT_AMBULATORY_CARE_PROVIDER_SITE_OTHER): Payer: PRIVATE HEALTH INSURANCE | Admitting: General Surgery

## 2013-04-12 ENCOUNTER — Encounter (INDEPENDENT_AMBULATORY_CARE_PROVIDER_SITE_OTHER): Payer: Self-pay | Admitting: General Surgery

## 2013-04-12 VITALS — BP 126/68 | HR 108 | Temp 99.4°F | Resp 16 | Ht 61.0 in | Wt 226.8 lb

## 2013-04-12 DIAGNOSIS — L089 Local infection of the skin and subcutaneous tissue, unspecified: Secondary | ICD-10-CM

## 2013-04-12 DIAGNOSIS — L723 Sebaceous cyst: Secondary | ICD-10-CM

## 2013-04-12 DIAGNOSIS — R222 Localized swelling, mass and lump, trunk: Secondary | ICD-10-CM

## 2013-04-12 HISTORY — DX: Local infection of the skin and subcutaneous tissue, unspecified: L08.9

## 2013-04-12 NOTE — Progress Notes (Signed)
HISTORY: Patient doing well status post removal of left chest wall mass following mastectomy. Turn out to be a lipomatous mass with spindle cell differentiation. There also was a lymph node in this tissue.  She is doing much better. The drain output has been between 15 and 20 ml/day for the last week.  Her pain is controlled. She is not taking narcotics. She does have a nodule on her left elbow that has been hurting.  She has been on antibiotics, but it continues to enlarge and become redder.  She has also been doing warm soaks. This has become quite painful.      EXAM: General:  Alert and oriented.   Incision:  Healing well.  No evidence of infection.  Drain removed. Skin.  L elbow with 1.5 cm firm mass with blanching erythema.  Mobile.  Distal to elbow approx 3 cm.     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 - BENIGN LYMPH NODE. - BENIGN CYST. - SEE COMMENT. Microscopic Comment The vast majority of the specimen is composed of mature adipose tissue. There are areas which consist of a bland spindle cell proliferation admixed with fibrous tissue. These spindle cells are positive for vimentin and negative for calponin and smooth muscle actin. Cytologic malignant features are not identified. 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   ASSESSMENT AND PLAN:   Chest wall mass Mass removed, and was benign.    Drain removed.  Follow up as needed.  Infected sebaceous cyst of left elbow Pt with infected sebaceous cyst left elbow.   Advised to continue antibiotic and warm compresses.  Told her to add antibiotic ointment.    Will see in the urgent office tomorrow for I&D.  If recurs, will remove.        Maudry Diego, MD Surgical Oncology, General & Endocrine Surgery [facility]  Provider Not In System No ref. provider  found

## 2013-04-12 NOTE — Assessment & Plan Note (Signed)
Mass removed, and was benign.    Drain removed.  Follow up as needed.

## 2013-04-12 NOTE — Assessment & Plan Note (Signed)
Pt with infected sebaceous cyst left elbow.   Advised to continue antibiotic and warm compresses.  Told her to add antibiotic ointment.    Will see in the urgent office tomorrow for I&D.  If recurs, will remove.

## 2013-04-12 NOTE — Patient Instructions (Signed)
See you tomorrow for infected sebaceous cyst.    Keep drain site covered while draining.    Place neosporin or other antibiotic ointment on left elbow.

## 2013-04-13 ENCOUNTER — Encounter (INDEPENDENT_AMBULATORY_CARE_PROVIDER_SITE_OTHER): Payer: Self-pay | Admitting: General Surgery

## 2013-04-13 ENCOUNTER — Ambulatory Visit (INDEPENDENT_AMBULATORY_CARE_PROVIDER_SITE_OTHER): Payer: PRIVATE HEALTH INSURANCE | Admitting: General Surgery

## 2013-04-13 ENCOUNTER — Other Ambulatory Visit (INDEPENDENT_AMBULATORY_CARE_PROVIDER_SITE_OTHER): Payer: Self-pay | Admitting: General Surgery

## 2013-04-13 VITALS — BP 126/78 | HR 102 | Temp 98.8°F | Resp 16 | Ht 64.0 in | Wt 227.2 lb

## 2013-04-13 DIAGNOSIS — L089 Local infection of the skin and subcutaneous tissue, unspecified: Secondary | ICD-10-CM

## 2013-04-13 DIAGNOSIS — L723 Sebaceous cyst: Secondary | ICD-10-CM

## 2013-04-13 NOTE — Patient Instructions (Signed)
Change outer dressing as needed.  Remove packing in 48 hours, then may shower.  Let warm soapy water run in wound with dressings off daily. Keep wound covered until closed.    Follow up in 2-4 weeks.

## 2013-04-13 NOTE — Progress Notes (Signed)
Incision and Drainage Procedure Note  Pre-operative Diagnosis: infected sebaceous cyst left elbow  Post-operative Diagnosis: same  Indications: infected, painful sebaceous cyst left elbow  Anesthesia: 1% lidocaine with epinephrine  Procedure Details  The procedure, risks and complications have been discussed in detail (including, infection, bleeding, need for additional procedures) with the patient, and the patient has signed consent to the procedure.  The patient was informed that the wound would be left open.    The skin was sterilely prepped and draped over the affected area in the usual fashion. After adequate local anesthesia, I&D with a #11 blade was performed on the left elbow. Purulent drainage: present. Cheesy material and some of cyst wall pulled out.  The remaining cyst wall was very mushy, and was not able to be grasped.   The patient was observed until stable.  Findings: Sebaceous cyst  EBL: min cc's  Drains: none  Condition: Tolerated procedure well   Complications: none.

## 2013-04-13 NOTE — Assessment & Plan Note (Signed)
Cleaned with betadine, given 1% lidocaine with epinephrine.   Incised and drained with #11 blade.  Purulent cheesy drainage cultures.    Packed with 1/4" Nugauze.  Advised to remove packing in 48 hours and wash daily.  Keep covered.    Follow up in 2 weeks.

## 2013-04-16 LAB — WOUND CULTURE: Organism ID, Bacteria: NO GROWTH

## 2013-05-03 ENCOUNTER — Encounter (INDEPENDENT_AMBULATORY_CARE_PROVIDER_SITE_OTHER): Payer: PRIVATE HEALTH INSURANCE | Admitting: General Surgery

## 2013-12-01 ENCOUNTER — Encounter (HOSPITAL_COMMUNITY): Payer: Self-pay | Admitting: Emergency Medicine

## 2013-12-01 ENCOUNTER — Emergency Department (HOSPITAL_COMMUNITY)
Admission: EM | Admit: 2013-12-01 | Discharge: 2013-12-01 | Disposition: A | Discharge status: Home / Self Care | Payer: PRIVATE HEALTH INSURANCE | Attending: Emergency Medicine | Admitting: Emergency Medicine

## 2013-12-01 ENCOUNTER — Emergency Department (HOSPITAL_COMMUNITY): Payer: PRIVATE HEALTH INSURANCE

## 2013-12-01 DIAGNOSIS — IMO0002 Reserved for concepts with insufficient information to code with codable children: Secondary | ICD-10-CM | POA: Insufficient documentation

## 2013-12-01 DIAGNOSIS — Z853 Personal history of malignant neoplasm of breast: Secondary | ICD-10-CM | POA: Insufficient documentation

## 2013-12-01 DIAGNOSIS — Z9889 Other specified postprocedural states: Secondary | ICD-10-CM | POA: Insufficient documentation

## 2013-12-01 DIAGNOSIS — R109 Unspecified abdominal pain: Secondary | ICD-10-CM | POA: Insufficient documentation

## 2013-12-01 DIAGNOSIS — Z8659 Personal history of other mental and behavioral disorders: Secondary | ICD-10-CM | POA: Insufficient documentation

## 2013-12-01 DIAGNOSIS — K921 Melena: Secondary | ICD-10-CM | POA: Insufficient documentation

## 2013-12-01 DIAGNOSIS — J45909 Unspecified asthma, uncomplicated: Secondary | ICD-10-CM | POA: Insufficient documentation

## 2013-12-01 DIAGNOSIS — Z933 Colostomy status: Secondary | ICD-10-CM | POA: Insufficient documentation

## 2013-12-01 DIAGNOSIS — Z3202 Encounter for pregnancy test, result negative: Secondary | ICD-10-CM | POA: Insufficient documentation

## 2013-12-01 LAB — CBC WITH DIFFERENTIAL/PLATELET
BASOS ABS: 0.1 10*3/uL (ref 0.0–0.1)
Basophils Relative: 2 % — ABNORMAL HIGH (ref 0–1)
EOS ABS: 0.5 10*3/uL (ref 0.0–0.7)
EOS PCT: 6 % — AB (ref 0–5)
HCT: 39.9 % (ref 36.0–46.0)
Hemoglobin: 12.8 g/dL (ref 12.0–15.0)
LYMPHS ABS: 2.3 10*3/uL (ref 0.7–4.0)
Lymphocytes Relative: 26 % (ref 12–46)
MCH: 27.2 pg (ref 26.0–34.0)
MCHC: 32.1 g/dL (ref 30.0–36.0)
MCV: 84.7 fL (ref 78.0–100.0)
Monocytes Absolute: 0.8 10*3/uL (ref 0.1–1.0)
Monocytes Relative: 9 % (ref 3–12)
NEUTROS PCT: 59 % (ref 43–77)
Neutro Abs: 5.2 10*3/uL (ref 1.7–7.7)
PLATELETS: 343 10*3/uL (ref 150–400)
RBC: 4.71 MIL/uL (ref 3.87–5.11)
RDW: 17.7 % — AB (ref 11.5–15.5)
WBC: 8.9 10*3/uL (ref 4.0–10.5)

## 2013-12-01 LAB — URINALYSIS, ROUTINE W REFLEX MICROSCOPIC
Bilirubin Urine: NEGATIVE
GLUCOSE, UA: NEGATIVE mg/dL
HGB URINE DIPSTICK: NEGATIVE
Ketones, ur: NEGATIVE mg/dL
LEUKOCYTES UA: NEGATIVE
Nitrite: NEGATIVE
PH: 7 (ref 5.0–8.0)
PROTEIN: NEGATIVE mg/dL
SPECIFIC GRAVITY, URINE: 1.016 (ref 1.005–1.030)
Urobilinogen, UA: 0.2 mg/dL (ref 0.0–1.0)

## 2013-12-01 LAB — PROTIME-INR
INR: 0.91 (ref 0.00–1.49)
Prothrombin Time: 12.1 seconds (ref 11.6–15.2)

## 2013-12-01 LAB — COMPREHENSIVE METABOLIC PANEL
ALBUMIN: 3.8 g/dL (ref 3.5–5.2)
ALK PHOS: 64 U/L (ref 39–117)
ALT: 6 U/L (ref 0–35)
AST: 14 U/L (ref 0–37)
BUN: 9 mg/dL (ref 6–23)
CALCIUM: 9.7 mg/dL (ref 8.4–10.5)
CO2: 27 mEq/L (ref 19–32)
Chloride: 102 mEq/L (ref 96–112)
Creatinine, Ser: 0.71 mg/dL (ref 0.50–1.10)
GFR calc Af Amer: >90 mL/min (ref >90)
GFR calc non Af Amer: >90 mL/min (ref >90)
GLUCOSE: 86 mg/dL (ref 70–99)
POTASSIUM: 4.5 meq/L (ref 3.7–5.3)
SODIUM: 140 meq/L (ref 137–147)
TOTAL PROTEIN: 7.8 g/dL (ref 6.0–8.3)
Total Bilirubin: 0.3 mg/dL (ref 0.3–1.2)

## 2013-12-01 LAB — POCT PREGNANCY, URINE: PREG TEST UR: NEGATIVE

## 2013-12-01 MED ORDER — IOHEXOL 300 MG/ML  SOLN
50.0000 mL | Freq: Once | INTRAMUSCULAR | Status: AC | PRN
  Administered 2013-12-01: 50 mL via ORAL

## 2013-12-01 MED ORDER — AMOXICILLIN-POT CLAVULANATE 875-125 MG PO TABS
1.0000 | ORAL_TABLET | Freq: Two times a day (BID) | ORAL | Status: DC
Start: 2013-12-01 — End: 2018-12-08

## 2013-12-01 MED ORDER — SODIUM CHLORIDE 0.9 % IV BOLUS (SEPSIS)
500.0000 mL | Freq: Once | INTRAVENOUS | Status: AC
  Administered 2013-12-01: 500 mL via INTRAVENOUS

## 2013-12-01 MED ORDER — SODIUM CHLORIDE 0.9 % IV SOLN
3.0000 g | Freq: Once | INTRAVENOUS | Status: AC
  Administered 2013-12-01: 3 g via INTRAVENOUS
  Filled 2013-12-01: qty 3

## 2013-12-01 MED ORDER — IOHEXOL 300 MG/ML  SOLN
100.0000 mL | Freq: Once | INTRAMUSCULAR | Status: AC | PRN
  Administered 2013-12-01: 100 mL via INTRAVENOUS

## 2013-12-01 MED ORDER — ACETAMINOPHEN 325 MG PO TABS
650.0000 mg | ORAL_TABLET | Freq: Once | ORAL | Status: AC
  Administered 2013-12-01: 650 mg via ORAL
  Filled 2013-12-01: qty 2

## 2013-12-01 NOTE — Discharge Instructions (Signed)
Please follow up with your primary care physician in 1-2 days. If you do not have one please call the [facility] number listed above. Please follow up with your surgeon tomorrow at your scheduled appointment. Please take your antibiotic until completion. Please read all discharge instructions and return precautions.   Colitis Colitis is inflammation of the colon. Colitis can be a short-term or long-standing (chronic) illness. Crohn's disease and ulcerative colitis are 2 types of colitis which are chronic. They usually require lifelong treatment. CAUSES  There are many different causes of colitis, including:  Viruses.  Germs (bacteria).  Medicine reactions. SYMPTOMS   Diarrhea.  Intestinal bleeding.  Pain.  Fever.  Throwing up (vomiting).  Tiredness (fatigue).  Weight loss.  Bowel blockage. DIAGNOSIS  The diagnosis of colitis is based on examination and stool or blood tests. X-rays, CT scan, and colonoscopy may also be needed. TREATMENT  Treatment may include:  Fluids given through the vein (intravenously).  Bowel rest (nothing to eat or drink for a period of time).  Medicine for pain and diarrhea.  Medicines (antibiotics) that kill germs.  Cortisone medicines.  Surgery. HOME CARE INSTRUCTIONS   Get plenty of rest.  Drink enough water and fluids to keep your urine clear or pale yellow.  Eat a well-balanced diet.  Call your caregiver for follow-up as recommended. SEEK IMMEDIATE MEDICAL CARE IF:   You develop chills.  You have an oral temperature above 102 F (38.9 C), not controlled by medicine.  You have extreme weakness, fainting, or dehydration.  You have repeated vomiting.  You develop severe belly (abdominal) pain or are passing bloody or tarry stools. MAKE SURE YOU:   Understand these instructions.  Will watch your condition.  Will get help right away if you are not doing well or get worse. Document Released: 11/07/2004  Document Revised: 12/23/2011 Document Reviewed: 02/02/2010 [facility] Patient Information 2014 [place], [place].    Abdominal Pain, Women Abdominal (stomach, pelvic, or belly) pain can be caused by many things. It is important to tell your doctor:  The location of the pain.  Does it come and go or is it present all the time?  Are there things that start the pain (eating certain foods, exercise)?  Are there other symptoms associated with the pain (fever, nausea, vomiting, diarrhea)? All of this is helpful to know when trying to find the cause of the pain. CAUSES   Stomach: virus or bacteria infection, or ulcer.  Intestine: appendicitis (inflamed appendix), regional ileitis (Crohn's disease), ulcerative colitis (inflamed colon), irritable bowel syndrome, diverticulitis (inflamed diverticulum of the colon), or cancer of the stomach or intestine.  Gallbladder disease or stones in the gallbladder.  Kidney disease, kidney stones, or infection.  Pancreas infection or cancer.  Fibromyalgia (pain disorder).  Diseases of the female organs:  Uterus: fibroid (non-cancerous) tumors or infection.  Fallopian tubes: infection or tubal pregnancy.  Ovary: cysts or tumors.  Pelvic adhesions (scar tissue).  Endometriosis (uterus lining tissue growing in the pelvis and on the pelvic organs).  Pelvic congestion syndrome (female organs filling up with blood just before the menstrual period).  Pain with the menstrual period.  Pain with ovulation (producing an egg).  Pain with an IUD (intrauterine device, birth control) in the uterus.  Cancer of the female organs.  Functional pain (pain not caused by a disease, may improve without treatment).  Psychological pain.  Depression. DIAGNOSIS  Your doctor will decide the seriousness of your pain by doing an examination.  Blood tests.  X-rays.  Ultrasound.  CT scan (computed tomography, special type of X-ray).  MRI (magnetic resonance  imaging).  Cultures, for infection.  Barium enema (dye inserted in the large intestine, to better view it with X-rays).  Colonoscopy (looking in intestine with a lighted tube).  Laparoscopy (minor surgery, looking in abdomen with a lighted tube).  Major abdominal exploratory surgery (looking in abdomen with a large incision). TREATMENT  The treatment will depend on the cause of the pain.   Many cases can be observed and treated at home.  Over-the-counter medicines recommended by your caregiver.  Prescription medicine.  Antibiotics, for infection.  Birth control pills, for painful periods or for ovulation pain.  Hormone treatment, for endometriosis.  Nerve blocking injections.  Physical therapy.  Antidepressants.  Counseling with a psychologist or psychiatrist.  Minor or major surgery. HOME CARE INSTRUCTIONS   Do not take laxatives, unless directed by your caregiver.  Take over-the-counter pain medicine only if ordered by your caregiver. Do not take aspirin because it can cause an upset stomach or bleeding.  Try a clear liquid diet (broth or water) as ordered by your caregiver. Slowly move to a bland diet, as tolerated, if the pain is related to the stomach or intestine.  Have a thermometer and take your temperature several times a day, and record it.  Bed rest and sleep, if it helps the pain.  Avoid sexual intercourse, if it causes pain.  Avoid stressful situations.  Keep your follow-up appointments and tests, as your caregiver orders.  If the pain does not go away with medicine or surgery, you may try:  Acupuncture.  Relaxation exercises (yoga, meditation).  Group therapy.  Counseling. SEEK MEDICAL CARE IF:   You notice certain foods cause stomach pain.  Your home care treatment is not helping your pain.  You need stronger pain medicine.  You want your IUD removed.  You feel faint or lightheaded.  You develop nausea and vomiting.  You  develop a rash.  You are having side effects or an allergy to your medicine. SEEK IMMEDIATE MEDICAL CARE IF:   Your pain does not go away or gets worse.  You have a fever.  Your pain is felt only in portions of the abdomen. The right side could possibly be appendicitis. The left lower portion of the abdomen could be colitis or diverticulitis.  You are passing blood in your stools (bright red or black tarry stools, with or without vomiting).  You have blood in your urine.  You develop chills, with or without a fever.  You pass out. MAKE SURE YOU:   Understand these instructions.  Will watch your condition.  Will get help right away if you are not doing well or get worse. Document Released: 07/28/2007 Document Revised: 12/23/2011 Document Reviewed: 08/17/2009 [facility] Patient Information 2014 [place], [place].

## 2013-12-01 NOTE — ED Provider Notes (Signed)
CSN: [id number]     Arrival date & time 12/01/13  1653 History   First MD Initiated Contact with Patient 12/01/13 1713     Chief Complaint  Patient presents with  . GI Bleeding  . Abdominal Pain    (Consider location/radiation/quality/duration/timing/severity/associated sxs/prior Treatment) HPI Comments: Patient is a 38 year old female with a hx of breast CA who presents to the emergency department for abdominal pain. Patient states that abdominal pain has been present for 4 days. Pain is constant and has been worsening since onset. Pain originated, per patient, and her left lower abdomen and has progressed to include her entire abdomen. Patient states that pain is worse with palpation. She has been taking Tylenol for pain with only mild relief. Patient denies associated fever, chest pain, shortness of breath, nausea or vomiting, hematemesis, melena, urinary symptoms, and numbness/tingling or weakness.  Patient endorses colectomy with colostomy placement at [facility] on 11/02/13 for perforated diverticulitis. Surgeon - Dr. Lovie Macadamia. She was started on a post op course of Cipo/Flagyl following the development of abdominal pain. Abx course finished 4 days prior to onset of abdominal pain and bloody stool.   Patient is a 38 y.o. female presenting with abdominal pain.  Abdominal Pain Associated symptoms: no chest pain, no diarrhea, no dysuria, no fever, no hematuria, no nausea, no shortness of breath and no vomiting     Past Medical History  Diagnosis Date  . Lymph node enlargement     left  . PONV (postoperative nausea and vomiting)     nausea  . Heart palpitations   . Headache(784.0)     monthly with menses  . Asthma     daily and prn inhalers  . Depression   . Cancer 12/2011    breast   Past Surgical History  Procedure Laterality Date  . Cesarean section  1999 and 2001  . Cardiac catheterization      04-10-2011  . Breast biopsy  09/25/2011    Procedure: BREAST BIOPSY;   Surgeon: Stark Klein, MD;  Location: [facility];  Service: General;  Laterality: Bilateral;  Excision Bilateral Breast Masses  . Hernia repair  age 86 or 44    IHR  . Knee surgery  2003    right patella  . Mastectomy  01/01/2012    bilateral  . Mass excision Left 03/24/2013    Procedure: EXCISION LEFT CHEST WALL MASS;  Surgeon: Stark Klein, MD;  Location: [place];  Service: General;  Laterality: Left;   Family History  Problem Relation Age of Onset  . Adopted: Yes  . Cancer Maternal Aunt     breast, ovarian  . Cancer Maternal Grandmother     breast, lung, brain (died at 61)  . Cancer Cousin 20    ovarian  . Diabetes Mother     hypoglycemia   History  Substance Use Topics  . Smoking status: Never Smoker   . Smokeless tobacco: Never Used  . Alcohol Use: No   OB History   Grav Para Term Preterm Abortions TAB SAB Ect Mult Living                 Review of Systems  Constitutional: Negative for fever.  Respiratory: Negative for shortness of breath.   Cardiovascular: Negative for chest pain.  Gastrointestinal: Positive for abdominal pain and blood in stool. Negative for nausea, vomiting and diarrhea.  Genitourinary: Negative for dysuria and hematuria.  All other systems reviewed and are negative.  Allergies  Aspirin; Shellfish-derived products; Hydrocodone; Norco; Oxycodone-acetaminophen; Dilaudid; and Tegaderm ag mesh  Home Medications   Current Outpatient Rx  Name  Route  Sig  Dispense  Refill  . Fluticasone-Salmeterol (ADVAIR) 500-50 MCG/DOSE AEPB   Inhalation   Inhale 1 puff into the lungs every 12 (twelve) hours.          . sertraline (ZOLOFT) 100 MG tablet   Oral   Take 100 mg by mouth at bedtime.          Marland Kitchen albuterol (PROVENTIL HFA;VENTOLIN HFA) 108 (90 BASE) MCG/ACT inhaler   Inhalation   Inhale 2 puffs into the lungs as needed.          Marland Kitchen EPINEPHrine (EPIPEN JR) 0.15 MG/0.3ML injection   Intramuscular   Inject 0.15 mg into the  muscle as needed. Uses adult dose epi pen prn          BP 119/90  Pulse 120  Temp(Src) 98.4 F (36.9 C) (Oral)  Resp 16  Ht 5' 4.5" (1.638 m)  Wt 200 lb (90.719 kg)  BMI 33.81 kg/m2  SpO2 100%  Physical Exam  Nursing note and vitals reviewed. Constitutional: She is oriented to person, place, and time. She appears well-developed and well-nourished. No distress.  HENT:  Head: Normocephalic and atraumatic.  Mouth/Throat: Oropharynx is clear and moist. No oropharyngeal exudate.  Eyes: Conjunctivae and EOM are normal. Pupils are equal, round, and reactive to light. No scleral icterus.  Neck: Normal range of motion.  Cardiovascular: Normal rate, regular rhythm and normal heart sounds.   Patient not tachycardic as noted in triage  Pulmonary/Chest: Effort normal. No respiratory distress.  Abdominal: Soft. She exhibits no mass. There is tenderness. There is guarding (voluntary). There is no rebound.  Well-healed surgical midline scar without erythema, swelling, or heat to touch. Patient has diffuse tenderness to palpation of the abdomen. Most tenderness appreciated to be supraumbilical at the site of her midline scar. Stoma with colostomy bag appreciated in LLQ with brown stool; no BRB or melena appreciated. No peritoneal signs.  Musculoskeletal: Normal range of motion.  Neurological: She is alert and oriented to person, place, and time.  Skin: Skin is warm and dry. No rash noted. She is not diaphoretic. No erythema. No pallor.  Psychiatric: She has a normal mood and affect. Her behavior is normal.    ED Course  Procedures (including critical care time) Labs Review Labs Reviewed  CBC WITH DIFFERENTIAL - Abnormal; Notable for the following:    RDW 17.7 (*)    Eosinophils Relative 6 (*)    Basophils Relative 2 (*)    All other components within normal limits  CLOSTRIDIUM DIFFICILE BY PCR  STOOL CULTURE  COMPREHENSIVE METABOLIC PANEL  PROTIME-INR  URINALYSIS, ROUTINE W REFLEX  MICROSCOPIC   Imaging Review No results found.  EKG Interpretation   None       MDM   Final diagnoses:  Abdominal pain   38 year old female presents for abdominal pain with onset 4 days ago. Patient s/p colectomy with colostomy performed on 11/02/2013 for perforated diverticulitis. Surgery performed at [facility] by Dr. Lovie Macadamia. Patient endorsing bright red blood mixed with brown stool in her ostomy bag. I do not appreciate any of this today. Patient is overall well and nontoxic appearing, hemodynamically stable, and afebrile. She has diffuse tenderness to palpation of her abdomen, most of which is appreciated to be at the site of her well-healed midline scar.  Labs today show no leukocytosis, anemia, or  electrolyte imbalance. Liver and kidney function preserved. Urinalysis does not suggest infection and urine pregnancy negative. CT abdomen and pelvis with contrast has been ordered to further evaluate the patient's abdominal pain. This is pending at present.  Patient signed out to [facility], PA-C at shift change who will follow CT scan. His CT shows no emergent cause of patient's pain, believe she is stable for discharge with followup with her surgeon. Patient states she has an appointment scheduled for 1:45PM tomorrow. If emergent process or need for surgical intervention, patient may require transfer to [facility] as this is where her initial surgery was performed. Patient made aware of this possibility and verbalizes understanding.    Antonietta Breach, PA-C 12/01/13 2014

## 2013-12-01 NOTE — ED Provider Notes (Signed)
Patient care acquired from [facility], [place]. CT abdomen and pelvis pending. Plan to discharge as long as no acute findings on CT scan as laboratory results are all within normal limits.  Patient is status post colon resection with colostomy placement at [facility] performed by Dr. Lovie Macadamia on January 20 of this year. She has been followed by Dr. Lovie Macadamia as an outpatient since then. Patient recently just finished the Cipro and Flagyl antibiotic course for diarrhea and abdominal pain and fevers this previous Friday. She developed lower abdominal pain 4 days ago. She is also noted some red blood in the colostomy bag with her stools. She denies any further fevers. Patient is scheduled to followup with Dr. Lovie Macadamia tomorrow.   Physical Exam  BP 113/68  Pulse 95  Temp(Src) 98.4 F (36.9 C) (Oral)  Resp 16  Ht 5' 4.5" (1.638 m)  Wt 200 lb (90.719 kg)  BMI 33.81 kg/m2  SpO2 100%  Physical Exam  Constitutional: She is oriented to person, place, and time. She appears well-developed and well-nourished. No distress.  HENT:  Head: Normocephalic and atraumatic.  Right Ear: External ear normal.  Left Ear: External ear normal.  Nose: Nose normal.  Eyes: Conjunctivae are normal.  Neck: Neck supple.  Cardiovascular: Normal rate, regular rhythm and normal heart sounds.   Pulmonary/Chest: Effort normal and breath sounds normal.  Abdominal: Soft. Bowel sounds are normal. She exhibits no distension. There is tenderness in the right lower quadrant, suprapubic area and left lower quadrant. There is no rigidity, no rebound and no guarding.    Colostomy site noted. Colostomy bag replaced. The surrounding skin of colostomy site is red with some skin breakdown. No active bleeding noted. No dark stools appreciated in colostomy bag.  Musculoskeletal: Normal range of motion.  Neurological: She is alert and oriented to person, place, and time.  Skin: Skin is warm and dry. She is not diaphoretic.    ED  Course  Procedures  Medications  Ampicillin-Sulbactam (UNASYN) 3 g in sodium chloride 0.9 % 100 mL IVPB (3 g Intravenous New Bag/Given 12/01/13 2200)  iohexol (OMNIPAQUE) 300 MG/ML solution 50 mL (50 mLs Oral Contrast Given 12/01/13 1802)  sodium chloride 0.9 % bolus 500 mL (0 mLs Intravenous Stopped 12/01/13 1910)  iohexol (OMNIPAQUE) 300 MG/ML solution 100 mL (100 mLs Intravenous Contrast Given 12/01/13 2012)  acetaminophen (TYLENOL) tablet 650 mg (650 mg Oral Given 12/01/13 2152)   Results for orders placed during the hospital encounter of 12/01/13  CBC WITH DIFFERENTIAL      Result Value Ref Range   WBC 8.9  4.0 - 10.5 K/uL   RBC 4.71  3.87 - 5.11 MIL/uL   Hemoglobin 12.8  12.0 - 15.0 g/dL   HCT 39.9  36.0 - 46.0 %   MCV 84.7  78.0 - 100.0 fL   MCH 27.2  26.0 - 34.0 pg   MCHC 32.1  30.0 - 36.0 g/dL   RDW 17.7 (*) 11.5 - 15.5 %   Platelets 343  150 - 400 K/uL   Neutrophils Relative % 59  43 - 77 %   Neutro Abs 5.2  1.7 - 7.7 K/uL   Lymphocytes Relative 26  12 - 46 %   Lymphs Abs 2.3  0.7 - 4.0 K/uL   Monocytes Relative 9  3 - 12 %   Monocytes Absolute 0.8  0.1 - 1.0 K/uL   Eosinophils Relative 6 (*) 0 - 5 %   Eosinophils Absolute 0.5  0.0 - 0.7  K/uL   Basophils Relative 2 (*) 0 - 1 %   Basophils Absolute 0.1  0.0 - 0.1 K/uL  COMPREHENSIVE METABOLIC PANEL      Result Value Ref Range   Sodium 140  137 - 147 mEq/L   Potassium 4.5  3.7 - 5.3 mEq/L   Chloride 102  96 - 112 mEq/L   CO2 27  19 - 32 mEq/L   Glucose, Bld 86  70 - 99 mg/dL   BUN 9  6 - 23 mg/dL   Creatinine, Ser 0.71  0.50 - 1.10 mg/dL   Calcium 9.7  8.4 - 10.5 mg/dL   Total Protein 7.8  6.0 - 8.3 g/dL   Albumin 3.8  3.5 - 5.2 g/dL   AST 14  0 - 37 U/L   ALT 6  0 - 35 U/L   Alkaline Phosphatase 64  39 - 117 U/L   Total Bilirubin 0.3  0.3 - 1.2 mg/dL   GFR calc non Af Amer >90  >90 mL/min   GFR calc Af Amer >90  >90 mL/min  PROTIME-INR      Result Value Ref Range   Prothrombin Time 12.1  11.6 - 15.2 seconds    INR 0.91  0.00 - 1.49  URINALYSIS, ROUTINE W REFLEX MICROSCOPIC      Result Value Ref Range   Color, Urine YELLOW  YELLOW   APPearance CLEAR  CLEAR   Specific Gravity, Urine 1.016  1.005 - 1.030   pH 7.0  5.0 - 8.0   Glucose, UA NEGATIVE  NEGATIVE mg/dL   Hgb urine dipstick NEGATIVE  NEGATIVE   Bilirubin Urine NEGATIVE  NEGATIVE   Ketones, ur NEGATIVE  NEGATIVE mg/dL   Protein, ur NEGATIVE  NEGATIVE mg/dL   Urobilinogen, UA 0.2  0.0 - 1.0 mg/dL   Nitrite NEGATIVE  NEGATIVE   Leukocytes, UA NEGATIVE  NEGATIVE  POCT PREGNANCY, URINE      Result Value Ref Range   Preg Test, Ur NEGATIVE  NEGATIVE   Ct Abdomen Pelvis W Contrast  12/01/2013   CLINICAL DATA:  Colon resection with colostomy, blood in the colostomy.  EXAM: CT ABDOMEN AND PELVIS WITH CONTRAST  TECHNIQUE: Multidetector CT imaging of the abdomen and pelvis was performed using the standard protocol following bolus administration of intravenous contrast.  CONTRAST:  121mL OMNIPAQUE IOHEXOL 300 MG/ML  SOLN  COMPARISON:  CT ABD-PELV W/ CM dated 11/25/2013; CT GUIDANCE NEEDLE PLACEMENT dated 11/11/2013; CT ABD-PELV W/ CM dated 11/09/2013  FINDINGS: Lung bases are clear.  No pericardial fluid.  No focal hepatic lesion. The gallbladder, pancreas, spleen, adrenal glands, and kidneys are normal.  The stomach, small bowel, appendix, cecum are normal. Again demonstrated left lower quadrant colostomy. There is inflammation along the colostomy tract similar prior. No evidence of abscess formation. The previous seen the lower abdominal pelvic abscesses have not reaccumulated.  The left ovary is prominent at 2.9 x 3.9 cm but unchanged from prior. The right ovary is normal at 3.3 x 2.5 cm.  Abdominal aorta is normal caliber. No retroperitoneal periportal lymphadenopathy.  No free fluid the pelvis. Bladder and uterus are normal. No aggressive osseous lesion.  IMPRESSION: 1. Continued inflammation involving the colon as it exits through the colostomy tract. This  is similar to prior. This could represent colitis or diverticulitis. 2. No drainable abscess formation along the colostomy tract. 3. No evidence of drainable fluid collections within the abdomen or pelvis. 4. No significant change from CT of 11/25/2013  Electronically Signed   By: Suzy Bouchard M.D.   On: 12/01/2013 20:35   Results for orders placed during the hospital encounter of 12/01/13  CBC WITH DIFFERENTIAL      Result Value Ref Range   WBC 8.9  4.0 - 10.5 K/uL   RBC 4.71  3.87 - 5.11 MIL/uL   Hemoglobin 12.8  12.0 - 15.0 g/dL   HCT 39.9  36.0 - 46.0 %   MCV 84.7  78.0 - 100.0 fL   MCH 27.2  26.0 - 34.0 pg   MCHC 32.1  30.0 - 36.0 g/dL   RDW 17.7 (*) 11.5 - 15.5 %   Platelets 343  150 - 400 K/uL   Neutrophils Relative % 59  43 - 77 %   Neutro Abs 5.2  1.7 - 7.7 K/uL   Lymphocytes Relative 26  12 - 46 %   Lymphs Abs 2.3  0.7 - 4.0 K/uL   Monocytes Relative 9  3 - 12 %   Monocytes Absolute 0.8  0.1 - 1.0 K/uL   Eosinophils Relative 6 (*) 0 - 5 %   Eosinophils Absolute 0.5  0.0 - 0.7 K/uL   Basophils Relative 2 (*) 0 - 1 %   Basophils Absolute 0.1  0.0 - 0.1 K/uL  COMPREHENSIVE METABOLIC PANEL      Result Value Ref Range   Sodium 140  137 - 147 mEq/L   Potassium 4.5  3.7 - 5.3 mEq/L   Chloride 102  96 - 112 mEq/L   CO2 27  19 - 32 mEq/L   Glucose, Bld 86  70 - 99 mg/dL   BUN 9  6 - 23 mg/dL   Creatinine, Ser 0.71  0.50 - 1.10 mg/dL   Calcium 9.7  8.4 - 10.5 mg/dL   Total Protein 7.8  6.0 - 8.3 g/dL   Albumin 3.8  3.5 - 5.2 g/dL   AST 14  0 - 37 U/L   ALT 6  0 - 35 U/L   Alkaline Phosphatase 64  39 - 117 U/L   Total Bilirubin 0.3  0.3 - 1.2 mg/dL   GFR calc non Af Amer >90  >90 mL/min   GFR calc Af Amer >90  >90 mL/min  PROTIME-INR      Result Value Ref Range   Prothrombin Time 12.1  11.6 - 15.2 seconds   INR 0.91  0.00 - 1.49  URINALYSIS, ROUTINE W REFLEX MICROSCOPIC      Result Value Ref Range   Color, Urine YELLOW  YELLOW   APPearance CLEAR  CLEAR   Specific  Gravity, Urine 1.016  1.005 - 1.030   pH 7.0  5.0 - 8.0   Glucose, UA NEGATIVE  NEGATIVE mg/dL   Hgb urine dipstick NEGATIVE  NEGATIVE   Bilirubin Urine NEGATIVE  NEGATIVE   Ketones, ur NEGATIVE  NEGATIVE mg/dL   Protein, ur NEGATIVE  NEGATIVE mg/dL   Urobilinogen, UA 0.2  0.0 - 1.0 mg/dL   Nitrite NEGATIVE  NEGATIVE   Leukocytes, UA NEGATIVE  NEGATIVE  POCT PREGNANCY, URINE      Result Value Ref Range   Preg Test, Ur NEGATIVE  NEGATIVE     MDM  Filed Vitals:   12/01/13 2115  BP: 117/78  Pulse: 101  Temp:   Resp:    Patient declines any stronger pain medication from Tylenol.  I have reviewed nursing notes, vital signs, and all appropriate lab and imaging results for this patient.  Afebrile, NAD,  non-toxic appearing, AAOx4. Abdomen soft, nontender, no guarding, rigidity, rebound. Colostomy site inspected. No active bleeding, skin breakdown on the skin surrounding colostomy bag, likely due to diarrhea and adhesive from bag. CT scan reviewed with inflammation, this is consistent with previous CT abdomen and pelvis obtained on 11/25/2013. No evidence of further abscess. No emergent surgical process appreciated on CT scan. Will give patient loading dose of Unasyn here in the emergency department and sent home on Augmentin. Patient was advised to keep followup appointment with surgeon in the morning. Return precautions were discussed. Patient is agreeable to plan. Patient is stable at time of discharge. Patient d/w with Dr. Christy Gentles, agrees with plan.    Harlow Mares, PA-C 12/01/13 2312

## 2013-12-01 NOTE — ED Provider Notes (Signed)
Patient seen/examined in the Emergency Department in conjunction with Midlevel Provider [place] Patient reports abd pain Exam : awake/alert, maex4, abd is soft to palpation Plan: plan is for her to see her surgeon tomorrow morning.  CT imaging reveals no acute change from recent imaging.     Sharyon Cable, MD 12/01/13 2200

## 2013-12-01 NOTE — ED Notes (Signed)
To ED had recent colon resection with colostomy placement at [facility] on the 20th of Jan. Pt now with blood in colostomy and abd pain. Unable to see surgeon due to weather.

## 2013-12-02 NOTE — ED Provider Notes (Signed)
Medical screening examination/treatment/procedure(s) were conducted as a shared visit with non-physician practitioner(s) and myself.  I personally evaluated the patient during the encounter.  EKG Interpretation   None         Sharyon Cable, MD 12/02/13 [phone]

## 2013-12-03 NOTE — ED Provider Notes (Signed)
Medical screening examination/treatment/procedure(s) were performed by non-physician practitioner and as supervising physician I was immediately available for consultation/collaboration.  EKG Interpretation   None         Mervin Kung, MD 12/03/13 [phone]

## 2014-01-31 NOTE — Telephone Encounter (Signed)
Please see Visit Info comments 

## 2014-02-04 DIAGNOSIS — F3340 Major depressive disorder, recurrent, in remission, unspecified: Secondary | ICD-10-CM

## 2014-02-04 DIAGNOSIS — M170 Bilateral primary osteoarthritis of knee: Secondary | ICD-10-CM

## 2014-02-04 HISTORY — DX: Bilateral primary osteoarthritis of knee: M17.0

## 2014-02-04 HISTORY — DX: Major depressive disorder, recurrent, in remission, unspecified: F33.40

## 2014-02-13 DIAGNOSIS — I498 Other specified cardiac arrhythmias: Secondary | ICD-10-CM

## 2014-02-13 DIAGNOSIS — G90A Postural orthostatic tachycardia syndrome (POTS): Secondary | ICD-10-CM | POA: Insufficient documentation

## 2014-02-13 DIAGNOSIS — R000 Tachycardia, unspecified: Secondary | ICD-10-CM

## 2014-02-13 DIAGNOSIS — I951 Orthostatic hypotension: Secondary | ICD-10-CM

## 2014-02-13 HISTORY — DX: Postural orthostatic tachycardia syndrome (POTS): G90.A

## 2014-02-13 HISTORY — DX: Other specified cardiac arrhythmias: I49.8

## 2014-11-15 DIAGNOSIS — G43909 Migraine, unspecified, not intractable, without status migrainosus: Secondary | ICD-10-CM

## 2014-11-15 DIAGNOSIS — Z86718 Personal history of other venous thrombosis and embolism: Secondary | ICD-10-CM

## 2014-11-15 HISTORY — DX: Personal history of other venous thrombosis and embolism: Z86.718

## 2014-11-15 HISTORY — DX: Migraine, unspecified, not intractable, without status migrainosus: G43.909

## 2014-12-06 DIAGNOSIS — Z86711 Personal history of pulmonary embolism: Secondary | ICD-10-CM

## 2014-12-06 HISTORY — DX: Personal history of pulmonary embolism: Z86.711

## 2015-05-19 DIAGNOSIS — Z9981 Dependence on supplemental oxygen: Secondary | ICD-10-CM

## 2015-05-19 HISTORY — DX: Dependence on supplemental oxygen: Z99.81

## 2015-08-08 DIAGNOSIS — D649 Anemia, unspecified: Secondary | ICD-10-CM

## 2015-08-08 HISTORY — DX: Anemia, unspecified: D64.9

## 2015-08-10 DIAGNOSIS — Z7901 Long term (current) use of anticoagulants: Secondary | ICD-10-CM | POA: Insufficient documentation

## 2015-08-10 HISTORY — DX: Long term (current) use of anticoagulants: Z79.01

## 2015-10-21 DIAGNOSIS — K651 Peritoneal abscess: Secondary | ICD-10-CM

## 2015-10-21 HISTORY — DX: Peritoneal abscess: K65.1

## 2016-02-19 DIAGNOSIS — M47816 Spondylosis without myelopathy or radiculopathy, lumbar region: Secondary | ICD-10-CM | POA: Insufficient documentation

## 2016-02-19 DIAGNOSIS — M5442 Lumbago with sciatica, left side: Secondary | ICD-10-CM

## 2016-02-19 DIAGNOSIS — M51369 Other intervertebral disc degeneration, lumbar region without mention of lumbar back pain or lower extremity pain: Secondary | ICD-10-CM

## 2016-02-19 DIAGNOSIS — G8929 Other chronic pain: Secondary | ICD-10-CM

## 2016-02-19 DIAGNOSIS — M5136 Other intervertebral disc degeneration, lumbar region: Secondary | ICD-10-CM

## 2016-02-19 HISTORY — DX: Spondylosis without myelopathy or radiculopathy, lumbar region: M47.816

## 2016-02-19 HISTORY — DX: Other chronic pain: G89.29

## 2016-02-19 HISTORY — DX: Other intervertebral disc degeneration, lumbar region: M51.36

## 2016-02-19 HISTORY — DX: Other intervertebral disc degeneration, lumbar region without mention of lumbar back pain or lower extremity pain: M51.369

## 2016-05-28 DIAGNOSIS — R0602 Shortness of breath: Secondary | ICD-10-CM

## 2016-05-28 HISTORY — DX: Shortness of breath: R06.02

## 2016-06-11 DIAGNOSIS — R413 Other amnesia: Secondary | ICD-10-CM

## 2016-06-11 DIAGNOSIS — E538 Deficiency of other specified B group vitamins: Secondary | ICD-10-CM

## 2016-06-11 HISTORY — DX: Deficiency of other specified B group vitamins: E53.8

## 2016-06-11 HISTORY — DX: Other amnesia: R41.3

## 2016-08-05 DIAGNOSIS — R112 Nausea with vomiting, unspecified: Secondary | ICD-10-CM | POA: Insufficient documentation

## 2016-08-05 DIAGNOSIS — Z9889 Other specified postprocedural states: Secondary | ICD-10-CM

## 2016-11-01 DIAGNOSIS — M533 Sacrococcygeal disorders, not elsewhere classified: Secondary | ICD-10-CM

## 2016-11-01 HISTORY — DX: Sacrococcygeal disorders, not elsewhere classified: M53.3

## 2018-02-01 DIAGNOSIS — J45901 Unspecified asthma with (acute) exacerbation: Secondary | ICD-10-CM

## 2018-02-01 DIAGNOSIS — J9621 Acute and chronic respiratory failure with hypoxia: Secondary | ICD-10-CM

## 2018-02-01 DIAGNOSIS — E876 Hypokalemia: Secondary | ICD-10-CM | POA: Diagnosis not present

## 2018-02-01 DIAGNOSIS — J189 Pneumonia, unspecified organism: Secondary | ICD-10-CM | POA: Diagnosis not present

## 2018-02-02 DIAGNOSIS — J45901 Unspecified asthma with (acute) exacerbation: Secondary | ICD-10-CM | POA: Diagnosis not present

## 2018-02-02 DIAGNOSIS — J9621 Acute and chronic respiratory failure with hypoxia: Secondary | ICD-10-CM | POA: Diagnosis not present

## 2018-02-02 DIAGNOSIS — J189 Pneumonia, unspecified organism: Secondary | ICD-10-CM | POA: Diagnosis not present

## 2018-02-02 DIAGNOSIS — E876 Hypokalemia: Secondary | ICD-10-CM | POA: Diagnosis not present

## 2018-02-03 DIAGNOSIS — J189 Pneumonia, unspecified organism: Secondary | ICD-10-CM | POA: Diagnosis not present

## 2018-02-03 DIAGNOSIS — J9621 Acute and chronic respiratory failure with hypoxia: Secondary | ICD-10-CM | POA: Diagnosis not present

## 2018-02-03 DIAGNOSIS — J45901 Unspecified asthma with (acute) exacerbation: Secondary | ICD-10-CM | POA: Diagnosis not present

## 2018-02-04 DIAGNOSIS — J9621 Acute and chronic respiratory failure with hypoxia: Secondary | ICD-10-CM | POA: Diagnosis not present

## 2018-02-04 DIAGNOSIS — J45901 Unspecified asthma with (acute) exacerbation: Secondary | ICD-10-CM | POA: Diagnosis not present

## 2018-02-04 DIAGNOSIS — E876 Hypokalemia: Secondary | ICD-10-CM | POA: Diagnosis not present

## 2018-02-04 DIAGNOSIS — J189 Pneumonia, unspecified organism: Secondary | ICD-10-CM | POA: Diagnosis not present

## 2018-02-05 DIAGNOSIS — J189 Pneumonia, unspecified organism: Secondary | ICD-10-CM | POA: Diagnosis not present

## 2018-02-05 DIAGNOSIS — J45901 Unspecified asthma with (acute) exacerbation: Secondary | ICD-10-CM | POA: Diagnosis not present

## 2018-02-05 DIAGNOSIS — J9621 Acute and chronic respiratory failure with hypoxia: Secondary | ICD-10-CM | POA: Diagnosis not present

## 2018-02-11 DIAGNOSIS — Z8701 Personal history of pneumonia (recurrent): Secondary | ICD-10-CM

## 2018-02-11 DIAGNOSIS — R932 Abnormal findings on diagnostic imaging of liver and biliary tract: Secondary | ICD-10-CM

## 2018-02-11 HISTORY — DX: Abnormal findings on diagnostic imaging of liver and biliary tract: R93.2

## 2018-02-11 HISTORY — DX: Personal history of pneumonia (recurrent): Z87.01

## 2018-02-12 DIAGNOSIS — K769 Liver disease, unspecified: Secondary | ICD-10-CM

## 2018-02-12 HISTORY — DX: Liver disease, unspecified: K76.9

## 2018-04-03 DIAGNOSIS — K439 Ventral hernia without obstruction or gangrene: Secondary | ICD-10-CM

## 2018-04-03 HISTORY — DX: Ventral hernia without obstruction or gangrene: K43.9

## 2018-05-11 DIAGNOSIS — J40 Bronchitis, not specified as acute or chronic: Secondary | ICD-10-CM

## 2018-05-11 DIAGNOSIS — J3089 Other allergic rhinitis: Secondary | ICD-10-CM | POA: Insufficient documentation

## 2018-05-11 HISTORY — DX: Bronchitis, not specified as acute or chronic: J40

## 2018-09-01 ENCOUNTER — Ambulatory Visit: Payer: 59 | Admitting: Cardiology

## 2018-09-23 ENCOUNTER — Ambulatory Visit: Payer: 59 | Admitting: Cardiology

## 2018-09-24 ENCOUNTER — Ambulatory Visit: Payer: 59 | Admitting: Cardiology

## 2018-10-25 DIAGNOSIS — R000 Tachycardia, unspecified: Secondary | ICD-10-CM

## 2018-10-25 HISTORY — DX: Tachycardia, unspecified: R00.0

## 2018-10-25 NOTE — Progress Notes (Deleted)
Cardiology Office Note:    Date:  10/25/2018   ID:  Tara Clarke, DOB 1975-10-22, MRN [id number]  PCP:  System, Provider Not In  Cardiologist:  Shirlee More, MD    Referring MD: No ref. provider found    ASSESSMENT:    No diagnosis found. PLAN:    In order of problems listed above:  1. ***   Next appointment: ***   Medication Adjustments/Labs and Tests Ordered: Current medicines are reviewed at length with the patient today.  Concerns regarding medicines are outlined above.  No orders of the defined types were placed in this encounter.  No orders of the defined types were placed in this encounter.   No chief complaint on file.   History of Present Illness:    Tara Clarke is a 43 y.o. female with a hx of obesity, asthma, hypoxemia and normal right heart cath in 2012 without findings of [place]. She was last seen by me 09/22/15 with POTS, DVT/PE with an IVC filter, mild AR  and marked symptomatic sinus tachycardia.Chest [facility]A 07/30/18 at [facility] showed no cardiovascular pathology.  CTA CHEST (PE STUDY) W CONTRAST, 07/30/2018 9:20 PM  INDICATION:h/o PE, dyspnea \ R06.02 SOB (shortness of breath)  COMPARISON: CT scan of the chest dated 02/17/2017 and contemporaneous CT scan of the abdomen.  TECHNIQUE: Iodinated contrast was administered intravenously by rapid injection, with further multislice axial sections acquired in the pulmonary arterial phase from the thoracic inlet to the upper abdomen. Coronal maximal intensity projection images were created for comprehensive analysis and diagnosis of the regional circulation.   [facility] and its affiliates are committed to minimizing radiation dose to patients while maintaining necessary diagnostic image quality. All CT scans are therefore performed using "As Low As Reasonably Achievable (ALARA)" protocols with either manual or automated exposure controls calibrated to the age and size of each  patient.  FINDINGS:  Pulmonary arteries: No pulmonary emboli identified. Aorta/great vessels: No aneurysm, dissection, or significant stenosis. Thoracic inlet/central airways: Thyroid normal. Airway patent. Left IJ Port-A-Cath tip terminates in the superior vena cava. Mediastinum/hila/axilla: No adenopathy. Multiple small mediastinal lymph nodes which are not enlarged by CT size criteria are similar to comparison. Left axillary surgical clips. Heart: Normal heart size. Pericardial fluid is within normal limits. Lungs/pleura: Endobronchial wall thickening suggestive of bronchitis not significantly changed. Scattered regions of mosaic attenuation are similar to prior. Upper abdomen: Please see report of contemporaneous CT scan of the abdomen. Chest wall/MSK: Mild multilevel degenerative change of the visualized spine. No acute osseous abnormality.  CONCLUSION:   1. No CT angiographic evidence of acute pulmonary thromboembolic disease. Negative for edema or pneumonia. 2. Similar endobronchial wall thickening compatible with bronchitis. Mosaic airspace pattern suggests small airway disease. Compliance with diet, lifestyle and medications: *** Past Medical History:  Diagnosis Date  . Asthma    daily and prn inhalers  . Cancer 12/2011   breast  . Depression   . Headache(784.0)    monthly with menses  . Heart palpitations   . Lymph node enlargement    left  . PONV (postoperative nausea and vomiting)    nausea    Past Surgical History:  Procedure Laterality Date  . BREAST BIOPSY  09/25/2011   Procedure: BREAST BIOPSY;  Surgeon: Stark Klein, MD;  Location: [facility];  Service: General;  Laterality: Bilateral;  Excision Bilateral Breast Masses  . CARDIAC CATHETERIZATION     04-10-2011  . [place] and 2001  .  HERNIA REPAIR  age 41 or 41   IHR  . KNEE SURGERY  2003   right patella  . MASS EXCISION Left 03/24/2013   Procedure: EXCISION LEFT CHEST WALL MASS;  Surgeon: Stark Klein, MD;  Location: [place];  Service: General;  Laterality: Left;  Marland Kitchen MASTECTOMY  01/01/2012   bilateral    Current Medications: No outpatient medications have been marked as taking for the 10/26/18 encounter (Appointment) with Richardo Priest, MD.     Allergies:   Aspirin; Shellfish-derived products; Hydrocodone; Norco [hydrocodone-acetaminophen]; Oxycodone-acetaminophen; Dilaudid [hydromorphone hcl]; and Tegaderm ag mesh [silver]   Social History   Socioeconomic History  . Marital status: Married    Spouse name: Not on file  . Number of children: Not on file  . Years of education: Not on file  . Highest education level: Not on file  Occupational History  . Not on file  Social Needs  . Financial resource strain: Not on file  . Food insecurity:    Worry: Not on file    Inability: Not on file  . Transportation needs:    Medical: Not on file    Non-medical: Not on file  Tobacco Use  . Smoking status: Never Smoker  . Smokeless tobacco: Never Used  Substance and Sexual Activity  . Alcohol use: No  . Drug use: No  . Sexual activity: Yes  Lifestyle  . Physical activity:    Days per week: Not on file    Minutes per session: Not on file  . Stress: Not on file  Relationships  . Social connections:    Talks on phone: Not on file    Gets together: Not on file    Attends religious service: Not on file    Active member of club or organization: Not on file    Attends meetings of clubs or organizations: Not on file    Relationship status: Not on file  Other Topics Concern  . Not on file  Social History Narrative  . Not on file     Family History: The patient's ***family history includes Cancer in her maternal aunt and maternal grandmother; Cancer (age of onset: 53) in her cousin; Diabetes in her mother. She was adopted. ROS:   Please see the history of present illness.    All other systems reviewed and are negative.  EKGs/Labs/Other Studies Reviewed:     The following studies were reviewed today:  EKG:  EKG ordered today.  The ekg ordered today demonstrates ***  Recent Labs:   07/1718 CMP and CBC normal No results found for requested labs within last 8760 hours.  Recent Lipid Panel No results found for: CHOL, TRIG, HDL, CHOLHDL, VLDL, LDLCALC, LDLDIRECT  Physical Exam:    VS:  There were no vitals taken for this visit.    Wt Readings from Last 3 Encounters:  12/01/13 200 lb (90.7 kg)  04/13/13 227 lb 3.2 oz (103.1 kg)  04/12/13 226 lb 12.8 oz (102.9 kg)     GEN: *** Well nourished, well developed in no acute distress HEENT: Normal NECK: No JVD; No carotid bruits LYMPHATICS: No lymphadenopathy CARDIAC: ***RRR, no murmurs, rubs, gallops RESPIRATORY:  Clear to auscultation without rales, wheezing or rhonchi  ABDOMEN: Soft, non-tender, non-distended MUSCULOSKELETAL:  No edema; No deformity  SKIN: Warm and dry NEUROLOGIC:  Alert and oriented x 3 PSYCHIATRIC:  Normal affect    Signed, Shirlee More, MD  10/25/2018 4:57 PM    [facility]

## 2018-10-26 ENCOUNTER — Ambulatory Visit: Payer: 59 | Admitting: Cardiology

## 2018-12-04 ENCOUNTER — Ambulatory Visit: Payer: 59 | Admitting: Cardiology

## 2018-12-08 ENCOUNTER — Encounter: Payer: Self-pay | Admitting: Cardiology

## 2018-12-08 ENCOUNTER — Ambulatory Visit (INDEPENDENT_AMBULATORY_CARE_PROVIDER_SITE_OTHER): Payer: Self-pay | Admitting: Cardiology

## 2018-12-08 VITALS — BP 120/68 | HR 100 | Ht 64.5 in | Wt 226.0 lb

## 2018-12-08 DIAGNOSIS — Z86711 Personal history of pulmonary embolism: Secondary | ICD-10-CM

## 2018-12-08 DIAGNOSIS — I951 Orthostatic hypotension: Secondary | ICD-10-CM

## 2018-12-08 DIAGNOSIS — Z7901 Long term (current) use of anticoagulants: Secondary | ICD-10-CM

## 2018-12-08 DIAGNOSIS — Z9981 Dependence on supplemental oxygen: Secondary | ICD-10-CM

## 2018-12-08 DIAGNOSIS — Z86718 Personal history of other venous thrombosis and embolism: Secondary | ICD-10-CM

## 2018-12-08 DIAGNOSIS — G90A Postural orthostatic tachycardia syndrome (POTS): Secondary | ICD-10-CM

## 2018-12-08 DIAGNOSIS — R000 Tachycardia, unspecified: Secondary | ICD-10-CM

## 2018-12-08 LAB — BASIC METABOLIC PANEL
BUN/Creatinine Ratio: 13 (ref 9–23)
BUN: 9 mg/dL (ref 6–24)
CALCIUM: 9.3 mg/dL (ref 8.7–10.2)
CO2: 22 mmol/L (ref 20–29)
CREATININE: 0.67 mg/dL (ref 0.57–1.00)
Chloride: 104 mmol/L (ref 96–106)
GFR calc Af Amer: 127 mL/min/{1.73_m2} (ref >59)
GFR calc non Af Amer: 110 mL/min/{1.73_m2} (ref >59)
Glucose: 92 mg/dL (ref 65–99)
Potassium: 4.6 mmol/L (ref 3.5–5.2)
Sodium: 142 mmol/L (ref 134–144)

## 2018-12-08 LAB — TSH: TSH: 1.03 u[IU]/mL (ref 0.450–4.500)

## 2018-12-08 NOTE — Patient Instructions (Signed)
Medication Instructions:   Your physician recommends that you continue on your current medications as directed. Please refer to the Current Medication list given to you today.   If you need a refill on your cardiac medications before your next appointment, please call your pharmacy.   Lab work:  Your physician recommends that you return for lab work today: BMP, TSH   If you have labs (blood work) drawn today and your tests are completely normal, you will receive your results only by: Marland Kitchen MyChart Message (if you have MyChart) OR . A paper copy in the mail If you have any lab test that is abnormal or we need to change your treatment, we will call you to review the results.  Testing/Procedures:  Your physician has requested that you have an echocardiogram. Echocardiography is a painless test that uses sound waves to create images of your heart. It provides your doctor with information about the size and shape of your heart and how well your heart's chambers and valves are working. This procedure takes approximately one hour. There are no restrictions for this procedure.  Your physician has recommended that you wear a holter monitor. Holter monitors are medical devices that record the heart's electrical activity. Doctors most often use these monitors to diagnose arrhythmias. Arrhythmias are problems with the speed or rhythm of the heartbeat. The monitor is a small, portable device. You can wear one while you do your normal daily activities. This is usually used to diagnose what is causing palpitations/syncope (passing out).    Follow-Up: At [facility], you and your health needs are our priority.  As part of our continuing mission to provide you with exceptional heart care, we have created designated Provider Care Teams.  These Care Teams include your primary Cardiologist (physician) and Advanced Practice Providers (APPs -  Physician Assistants and Nurse Practitioners) who all work together to  provide you with the care you need, when you need it. . You will need a follow up appointment in 6 months.  Please call our office 2 months in advance to schedule this appointment.    Any Other Special Instructions Will Be Listed Below (If Applicable).   Echocardiogram An echocardiogram is a procedure that uses painless sound waves (ultrasound) to produce an image of the heart. Images from an echocardiogram can provide important information about:  Signs of coronary artery disease (CAD).  Aneurysm detection. An aneurysm is a weak or damaged part of an artery wall that bulges out from the normal force of blood pumping through the body.  Heart size and shape. Changes in the size or shape of the heart can be associated with certain conditions, including heart failure, aneurysm, and CAD.  Heart muscle function.  Heart valve function.  Signs of a past heart attack.  Fluid buildup around the heart.  Thickening of the heart muscle.  A tumor or infectious growth around the heart valves. Tell a health care provider about:  Any allergies you have.  All medicines you are taking, including vitamins, herbs, eye drops, creams, and over-the-counter medicines.  Any blood disorders you have.  Any surgeries you have had.  Any medical conditions you have.  Whether you are pregnant or may be pregnant. What are the risks? Generally, this is a safe procedure. However, problems may occur, including:  Allergic reaction to dye (contrast) that may be used during the procedure. What happens before the procedure? No specific preparation is needed. You may eat and drink normally. What happens during  the procedure?   An IV tube may be inserted into one of your veins.  You may receive contrast through this tube. A contrast is an injection that improves the quality of the pictures from your heart.  A gel will be applied to your chest.  A wand-like tool (transducer) will be moved over your  chest. The gel will help to transmit the sound waves from the transducer.  The sound waves will harmlessly bounce off of your heart to allow the heart images to be captured in real-time motion. The images will be recorded on a computer. The procedure may vary among health care providers and hospitals. What happens after the procedure?  You may return to your normal, everyday life, including diet, activities, and medicines, unless your health care provider tells you not to do that. Summary  An echocardiogram is a procedure that uses painless sound waves (ultrasound) to produce an image of the heart.  Images from an echocardiogram can provide important information about the size and shape of your heart, heart muscle function, heart valve function, and fluid buildup around your heart.  You do not need to do anything to prepare before this procedure. You may eat and drink normally.  After the echocardiogram is completed, you may return to your normal, everyday life, unless your health care provider tells you not to do that. This information is not intended to replace advice given to you by your health care provider. Make sure you discuss any questions you have with your health care provider. Document Released: 09/27/2000 Document Revised: 11/02/2016 Document Reviewed: 11/02/2016 Elsevier Interactive Patient Education  2019 [employer].

## 2018-12-08 NOTE — Addendum Note (Signed)
Addended by: Orland Penman on: 12/08/2018 09:43 AM   Modules accepted: Orders

## 2018-12-08 NOTE — Progress Notes (Signed)
Cardiology Office Note:    Date:  12/08/2018   ID:  Tara Clarke, DOB [birth year], MRN [id number]  PCP:  System, Provider Not In  Cardiologist:  Jenean Lindau, MD   Referring MD: No ref. provider found    ASSESSMENT:    1. POTS (postural orthostatic tachycardia syndrome)   2. Chronic anticoagulation   3. History of DVT (deep vein thrombosis)   4. Hx pulmonary embolism   5. On home oxygen therapy    PLAN:    In order of problems listed above:  1. Primary prevention stressed with the patient.  Importance of compliance with diet and medication stressed and she vocalized understanding.  She has no significant palpitations or any such issues.  She has no issues with hypotension at this time and therefore I would prefer to hold off on her beta-blocker and midodrine since she is doing well.  She will have a Chem-7 and a TSH today. 2. Her anticoagulation issues are followed by her primary care physician in view of history of multiple thrombi embolism. 3. She mentions to me that she needs her oxygen to be continued.  When her primary care provider checked her oxygen the office it was more than 90%.  She tells me that it drops to lower at home.  I mentioned to her that she needs to discuss this with her primary care provider about instituting some form of ambulatory oxygen saturation check to make sure that she gets oxygen therapy if appropriate.  I would leave this decision entirely up to primary care.  Her oxygenation issues could be affected by history of pulmonary embolisms in the past.  Again she will need some form of monitoring on an outpatient basis by her primary care physician or pulmonologist to access her home oxygen needs based on her oxygen saturation and monitoring. 4. She will have an echocardiogram to assess murmur heard on auscultation.  48-hour Holter monitoring will be done to assess for any significant tachyarrhythmias 5. Patient will be seen in follow-up appointment in 6  months or earlier if the patient has any concerns    Medication Adjustments/Labs and Tests Ordered: Current medicines are reviewed at length with the patient today.  Concerns regarding medicines are outlined above.  No orders of the defined types were placed in this encounter.  No orders of the defined types were placed in this encounter.    History of Present Illness:    Tara Clarke is a 43 y.o. female who is being seen today for the evaluation of POTS at the request of her primary care physician.  Patient is a pleasant 43 year old female.  She has history of pots syndrome.  She also has history of multiple thromboembolism and pulmonary embolism for which she is on anticoagulation.  She denies any chest pain orthopnea or PND.  She mentions to me Dr. Bettina Gavia had her on midodrine and beta-blocker in the past but this was discontinued during her hospitalization many years ago.  Subsequently she is done fine.  No chest pain orthopnea PND.  She occasionally has palpitations.  At the time of my evaluation, the patient is alert awake oriented and in no distress.  Past Medical History:  Diagnosis Date  . Abdominal wall hernia 04/03/2018  . Abnormal CT of liver 02/11/2018  . Anemia 08/08/2015  . Arthritis of both knees 02/04/2014  . Asthma    daily and prn inhalers  . Asthma 12/25/2009  . B12 deficiency 06/11/2016   2017:  B12 170, MMA: 194 HB 12/89 2017: parenteral, did not increase with oral, has had bowel surgery  . Breast mass, right 1 cm 11 oclock 08/16/2011  . Bronchitis 05/11/2018  . Cancer ([place]) 12/2011   breast  . Chest wall mass 03/01/2013  . Chronic anticoagulation 08/10/2015   For recurrent DVT and PE off anticoagulation  . Chronic bilateral low back pain with left-sided sciatica 02/19/2016  . Community acquired pneumonia 01/11/2012  . DDD (degenerative disc disease), lumbar 02/19/2016  . Depression   . Family history of breast cancer in mother 10/21/2011  . Fibroadenoma of breast, left  side, biopsy proven. 08/16/2011  . Headache(784.0)    monthly with menses  . Heart palpitations   . History of bacterial pneumonia 02/11/2018  . 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 Hx pulmonary embolism 12/06/2014   PE x2 both provoked after surgery, 12/2013, 11/2014  . Infected sebaceous cyst of left elbow 04/12/2013  . Intra-abdominal abscess ([place]) 10/21/2015  . Intractable migraine with aura 12/25/2009  . Liver lesion 02/12/2018   Last Assessment & Plan:  She will be scheduled for an MRI/MRCP of the liver in the near future to better characterize this lesion.  While there are multiple benign possibilities, including hemangioma, adenoma, and focal nodular hyperplasia, it is concerning that this lesion was not seen on her prior chest CT in 2018.  Given her symptoms of worsening nausea and unintentional weight loss,the MRCP po  . Lumbar facet arthropathy 02/19/2016  . Lymph node enlargement    left  . Memory loss 06/11/2016  . Migraines 11/15/2014  . On home oxygen therapy 05/19/2015   3L continuous; initiated by Pulmonologist on 02/2015  . Pleuritic chest pain 02/10/2012  . PONV (postoperative nausea and vomiting)    nausea  . POTS (postural orthostatic tachycardia syndrome) 02/13/2014  . Recurrent major depressive disorder, in remission ([place]) 02/04/2014  . Sacroiliac joint pain 11/01/2016   Added automatically from request for surgery [phone]  . Sinus tachycardia 10/25/2018  . SOB (shortness of breath) 05/28/2016  . Wound seroma 04/30/2012    Past Surgical History:  Procedure Laterality Date  . BREAST BIOPSY  09/25/2011   Procedure: BREAST BIOPSY;  Surgeon: Stark Klein, MD;  Location: [facility];  Service: General;  Laterality: Bilateral;  Excision Bilateral Breast Masses  .  CARDIAC CATHETERIZATION     04-10-2011  . [place] and 2001  . HERNIA REPAIR  age 10 or 46   IHR  . KNEE SURGERY  2003   right patella  . MASS EXCISION Left 03/24/2013   Procedure: EXCISION LEFT CHEST WALL MASS;  Surgeon: Stark Klein, MD;  Location: [place];  Service: General;  Laterality: Left;  Marland Kitchen MASTECTOMY  01/01/2012   bilateral    Current Medications: Current Meds  Medication Sig  . albuterol (PROVENTIL HFA;VENTOLIN HFA) 108 (90 BASE) MCG/ACT inhaler Inhale 2 puffs into the lungs as needed.   Marland Kitchen EPINEPHrine (EPIPEN JR) 0.15 MG/0.3ML injection Inject 0.15 mg into the muscle as needed. Uses adult dose epi pen prn  . Fluticasone-Salmeterol (ADVAIR) 500-50 MCG/DOSE AEPB Inhale 1 puff into the lungs every 12 (twelve) hours.   . rivaroxaban (XARELTO) 20 MG TABS tablet Take 1 tablet by mouth daily.  Marland Kitchen  sertraline (ZOLOFT) 100 MG tablet Take 100 mg by mouth at bedtime.      Allergies:   Aspirin; Bee venom; Shellfish-derived products; Hydrocodone; Norco [hydrocodone-acetaminophen]; Oxycodone-acetaminophen; Dilaudid [hydromorphone hcl]; Tape; Other; and Tegaderm ag mesh [silver]   Social History   Socioeconomic History  . Marital status: Married    Spouse name: Not on file  . Number of children: Not on file  . Years of education: Not on file  . Highest education level: Not on file  Occupational History  . Not on file  Social Needs  . Financial resource strain: Not on file  . Food insecurity:    Worry: Not on file    Inability: Not on file  . Transportation needs:    Medical: Not on file    Non-medical: Not on file  Tobacco Use  . Smoking status: Never Smoker  . Smokeless tobacco: Never Used  Substance and Sexual Activity  . Alcohol use: No  . Drug use: No  . Sexual activity: Yes  Lifestyle  . Physical activity:    Days per week: Not on file    Minutes per session: Not on file  . Stress: Not on file  Relationships  . Social connections:     Talks on phone: Not on file    Gets together: Not on file    Attends religious service: Not on file    Active member of club or organization: Not on file    Attends meetings of clubs or organizations: Not on file    Relationship status: Not on file  Other Topics Concern  . Not on file  Social History Narrative  . Not on file     Family History: The patient's family history includes Cancer in her maternal aunt and maternal grandmother; Cancer (age of onset: 38) in her cousin; Diabetes in her mother. She was adopted.  ROS:   Please see the history of present illness.    All other systems reviewed and are negative.  EKGs/Labs/Other Studies Reviewed:    The following studies were reviewed today: I discussed my findings with the patient at extensive length.  EKG reveals sinus rhythm and nonspecific ST-T changes.   Recent Labs: No results found for requested labs within last 8760 hours.  Recent Lipid Panel No results found for: CHOL, TRIG, HDL, CHOLHDL, VLDL, LDLCALC, LDLDIRECT  Physical Exam:    VS:  BP 120/68 (BP Location: Right Arm, Patient Position: Sitting, Cuff Size: Normal)   Pulse 100   Ht 5' 4.5" (1.638 m)   Wt 226 lb (102.5 kg)   SpO2 98%   BMI 38.19 kg/m     Wt Readings from Last 3 Encounters:  12/08/18 226 lb (102.5 kg)  12/01/13 200 lb (90.7 kg)  04/13/13 227 lb 3.2 oz (103.1 kg)     GEN: Patient is in no acute distress HEENT: Normal NECK: No JVD; No carotid bruits LYMPHATICS: No lymphadenopathy CARDIAC: S1 S2 regular, 2/6 systolic murmur at the apex. RESPIRATORY:  Clear to auscultation without rales, wheezing or rhonchi  ABDOMEN: Soft, non-tender, non-distended MUSCULOSKELETAL:  No edema; No deformity  SKIN: Warm and dry NEUROLOGIC:  Alert and oriented x 3 PSYCHIATRIC:  Normal affect    Signed, Jenean Lindau, MD  12/08/2018 9:13 AM    [facility]

## 2018-12-09 ENCOUNTER — Telehealth: Payer: Self-pay

## 2018-12-09 NOTE — Telephone Encounter (Signed)
Attempted to inform pt of normal lab results. LM for pt to call back.

## 2018-12-09 NOTE — Telephone Encounter (Signed)
-----   Message from Jenean Lindau, MD sent at 12/08/2018  5:00 PM EST ----- The results of the study is unremarkable. Please inform patient. I will discuss in detail at next appointment. Cc  primary care/referring physician Jenean Lindau, MD 12/08/2018 5:00 PM

## 2018-12-25 ENCOUNTER — Ambulatory Visit: Payer: 59 | Admitting: Cardiology

## 2019-01-07 ENCOUNTER — Telehealth: Payer: Self-pay | Admitting: Cardiology

## 2019-01-07 NOTE — Telephone Encounter (Signed)
° °  Primary Cardiologist:  [facility]  Patient contacted.  History reviewed.  No symptoms to suggest any unstable cardiac conditions.  Based on discussion, with current pandemic situation, we will be postponing this appointment for Tara Clarke with a plan for f/u in 6-12 wks or sooner if feasible/necessary.  If symptoms change, she has been instructed to contact our office.     Calla Kicks  01/07/2019 4:35 PM         .

## 2019-01-11 NOTE — Addendum Note (Signed)
Addended by: Jerl Santos R on: 01/11/2019 11:18 AM   Modules accepted: Orders

## 2019-01-12 ENCOUNTER — Other Ambulatory Visit: Payer: Self-pay

## 2019-01-13 ENCOUNTER — Other Ambulatory Visit: Payer: Self-pay

## 2019-01-15 ENCOUNTER — Telehealth: Payer: Self-pay | Admitting: *Deleted

## 2019-01-15 NOTE — Telephone Encounter (Signed)
Pt called to make sure she has monitor on right and probably will not hit button every time has symptom. Wanted to verify she send monitor back on Monday 4/6. She will call back if any further questions.

## 2019-01-19 ENCOUNTER — Other Ambulatory Visit: Payer: 59

## 2019-01-27 ENCOUNTER — Telehealth: Payer: Self-pay | Admitting: Cardiology

## 2019-01-27 ENCOUNTER — Other Ambulatory Visit: Payer: Self-pay

## 2019-01-27 MED ORDER — METOPROLOL TARTRATE 25 MG PO TABS: ORAL_TABLET | ORAL | 3 refills | Status: DC

## 2019-01-27 NOTE — Telephone Encounter (Signed)
Patient was called to follow up on [place] and she wants results of her monitor that she wore, please call patient.

## 2019-01-27 NOTE — Telephone Encounter (Signed)
Per Dr.RRR, patient should go back on metoprolol 25 mg (1 tablet) 3x's daily and monitor/record BP 2x's daily. Patient informed, no further questions.

## 2019-01-27 NOTE — Telephone Encounter (Signed)
Results relayed to patient and she asked about recommendation for restarting beta-blocker/midodrine that was discontinued.Sent to Dr. Docia Furl to advise how he would like to proceed. Copy of results sent to Dr. Yvette Rack office per Dr.RRR.

## 2019-01-27 NOTE — Telephone Encounter (Signed)
She can start beta blocker as previous dose and keep track of p bp and let us know in a week

## 2019-01-27 NOTE — Telephone Encounter (Signed)
Patient has frequent palpitations, heart racing and dizziness when standing. She is on 3L O2 with no SOB, no chest pain.

## 2019-01-27 NOTE — Telephone Encounter (Signed)
-----   Message from Jenean Lindau, MD sent at 01/27/2019 11:05 AM EDT ----- The results of the study is unremarkable. Please inform patient. I will discuss in detail at next appointment. Cc  primary care/referring physician Jenean Lindau, MD 01/27/2019 11:05 AM

## 2019-01-27 NOTE — Telephone Encounter (Signed)
Please check with her if she has any symptoms suggesting of hypotension and then we will consider these medicines

## 2019-05-17 DIAGNOSIS — M228X9 Other disorders of patella, unspecified knee: Secondary | ICD-10-CM | POA: Insufficient documentation

## 2019-06-08 ENCOUNTER — Ambulatory Visit: Payer: Self-pay | Admitting: Cardiology

## 2019-06-16 ENCOUNTER — Ambulatory Visit: Payer: Self-pay | Admitting: Cardiology

## 2019-07-07 ENCOUNTER — Other Ambulatory Visit: Payer: 59

## 2019-07-28 ENCOUNTER — Ambulatory Visit (INDEPENDENT_AMBULATORY_CARE_PROVIDER_SITE_OTHER): Payer: 59

## 2019-07-28 ENCOUNTER — Other Ambulatory Visit: Payer: Self-pay

## 2019-07-28 DIAGNOSIS — Z86718 Personal history of other venous thrombosis and embolism: Secondary | ICD-10-CM

## 2019-07-28 DIAGNOSIS — Z7901 Long term (current) use of anticoagulants: Secondary | ICD-10-CM | POA: Diagnosis not present

## 2019-07-28 DIAGNOSIS — Z9981 Dependence on supplemental oxygen: Secondary | ICD-10-CM

## 2019-07-28 DIAGNOSIS — I498 Other specified cardiac arrhythmias: Secondary | ICD-10-CM | POA: Diagnosis not present

## 2019-07-28 DIAGNOSIS — Z86711 Personal history of pulmonary embolism: Secondary | ICD-10-CM

## 2019-07-28 NOTE — Progress Notes (Signed)
Complete echocardiogram has been performed.  Jimmy Yuliza Cara RDCS, RVT 

## 2019-07-30 ENCOUNTER — Ambulatory Visit: Payer: Self-pay | Admitting: Cardiology

## 2019-08-04 ENCOUNTER — Telehealth: Payer: Self-pay

## 2019-08-04 DIAGNOSIS — R931 Abnormal findings on diagnostic imaging of heart and coronary circulation: Secondary | ICD-10-CM | POA: Insufficient documentation

## 2019-08-04 DIAGNOSIS — R9431 Abnormal electrocardiogram [ECG] [EKG]: Secondary | ICD-10-CM

## 2019-08-04 NOTE — Telephone Encounter (Signed)
Results relayed patient ok with having lexi performed at [facility]. Copy of results sent to Dr. Garlon Hatchet per Dr.RRR. RH forms filled out, given to Dr.Tobb for signature in RRR absence, forwarded to L.Judeth Horn.

## 2019-08-04 NOTE — Addendum Note (Signed)
Addended by: Beckey Rutter on: 08/04/2019 10:58 AM   Modules accepted: Orders

## 2019-08-09 ENCOUNTER — Telehealth: Payer: Self-pay | Admitting: Cardiology

## 2019-08-09 DIAGNOSIS — R0609 Other forms of dyspnea: Secondary | ICD-10-CM

## 2019-08-09 NOTE — Telephone Encounter (Signed)
Took her stress test at [facility] today

## 2019-08-12 NOTE — Telephone Encounter (Signed)
Results from lexi relayed, patient will discuss in more detail on 08/20/19. No further questions at this time.

## 2019-08-20 ENCOUNTER — Other Ambulatory Visit: Payer: Self-pay

## 2019-08-20 ENCOUNTER — Ambulatory Visit (INDEPENDENT_AMBULATORY_CARE_PROVIDER_SITE_OTHER): Payer: 59 | Admitting: Cardiology

## 2019-08-20 ENCOUNTER — Encounter: Payer: Self-pay | Admitting: Cardiology

## 2019-08-20 VITALS — BP 122/82 | HR 96 | Ht 61.0 in | Wt 247.0 lb

## 2019-08-20 DIAGNOSIS — R0602 Shortness of breath: Secondary | ICD-10-CM

## 2019-08-20 DIAGNOSIS — Z13220 Encounter for screening for lipoid disorders: Secondary | ICD-10-CM

## 2019-08-20 DIAGNOSIS — R931 Abnormal findings on diagnostic imaging of heart and coronary circulation: Secondary | ICD-10-CM | POA: Diagnosis not present

## 2019-08-20 DIAGNOSIS — Z1329 Encounter for screening for other suspected endocrine disorder: Secondary | ICD-10-CM

## 2019-08-20 MED ORDER — ENOXAPARIN SODIUM 30 MG/0.3ML ~~LOC~~ SOLN: 30.0000 mg | Freq: Two times a day (BID) | SUBCUTANEOUS | 4 refills | Status: DC

## 2019-08-20 NOTE — H&P (View-Only) (Signed)
Cardiology Office Note:    Date:  08/20/2019   ID:  Tara Clarke, DOB 12-30-1975, MRN [id number]  PCP:  Algis Greenhouse, MD  Cardiologist:  Jenean Lindau, MD   Referring MD: Algis Greenhouse, MD    ASSESSMENT:    1. Abnormal nuclear cardiac imaging test   2. SOB (shortness of breath)   3. Screening cholesterol level   4. Thyroid disorder screen    PLAN:    In order of problems listed above:  1. Abnormal nuclear stress test: I discussed my findings with the patient at extensive length and all questions were answered to her satisfaction.  I gave her the option of conventional coronary angiography and CT coronary angiography and she opted for the former.I discussed coronary angiography and left heart catheterization with the patient at extensive length. Procedure, benefits and potential risks were explained. Patient had multiple questions which were answered to the patient's satisfaction. Patient agreed and consented for the procedure. Further recommendations will be made based on the findings of the coronary angiography. In the interim. The patient has any significant symptoms he knows to go to the nearest emergency room. 2. Patient has had multiple history of deep venous thrombosis and pulmonary embolism and is very keen on undergoing bridging with Lovenox.  We respect his wishes and she will need to be bridged in we will plan for this.  Patient is allergic to aspirin and was therefore advised not to take this medication. 3. Findings of the echocardiogram were discussed with the patient at length.  Further recommendations will be made based on the findings of the coronary angiography.  She knows to go to the nearest emergency room for any concerning symptoms.   Medication Adjustments/Labs and Tests Ordered: Current medicines are reviewed at length with the patient today.  Concerns regarding medicines are outlined above.  Orders Placed This Encounter  Procedures  . CBC  . Basic  Metabolic Panel (BMET)  . TSH  . Hepatic function panel  . Lipid Profile   Meds ordered this encounter  Medications  . enoxaparin (LOVENOX) 30 MG/0.3ML injection    Sig: Inject 0.3 mLs (30 mg total) into the skin every 12 (twelve) hours.    Dispense:  30 mL    Refill:  4     No chief complaint on file.    History of Present Illness:    Tara Clarke is a 43 y.o. female..  Patient has history of chronic recurrent DVT and pulmonary embolism.  She underwent echocardiogram testing.  She has history of POTS disease.  She uses beta-blockers on a regular basis and is feeling fine.  No chest pain orthopnea or PND.  At the time of my evaluation, the patient is alert awake oriented and in no distress.  Past Medical History:  Diagnosis Date  . Abdominal wall hernia 04/03/2018  . Abnormal CT of liver 02/11/2018  . Anemia 08/08/2015  . Arthritis of both knees 02/04/2014  . Asthma    daily and prn inhalers  . Asthma 12/25/2009  . B12 deficiency 06/11/2016   2017: B12 170, MMA: 194 HB 12/89 2017: parenteral, did not increase with oral, has had bowel surgery  . Breast mass, right 1 cm 11 oclock 08/16/2011  . Bronchitis 05/11/2018  . Cancer ([place]) 12/2011   breast  . Chest wall mass 03/01/2013  . Chronic anticoagulation 08/10/2015   For recurrent DVT and PE off anticoagulation  . Chronic bilateral low back pain with left-sided  sciatica 02/19/2016  . Community acquired pneumonia 01/11/2012  . DDD (degenerative disc disease), lumbar 02/19/2016  . Depression   . Family history of breast cancer in mother 10/21/2011  . Fibroadenoma of breast, left side, biopsy proven. 08/16/2011  . Headache(784.0)    monthly with menses  . Heart palpitations   . History of bacterial pneumonia 02/11/2018  . History of DVT (deep vein thrombosis) 11/15/2014   RUE DVT/PEs 12/2013, completed anticoag tx x 6 months Recurrent RLE DVT/PE 11/2014  Last Assessment & Plan:  Relevant Hx: Course: Daily Update: Today's Plan:  Electronically  signed by: Earlie Server, MD 12/06/14 1401  . Hx pulmonary embolism 12/06/2014   PE x2 both provoked after surgery, 12/2013, 11/2014  . Infected sebaceous cyst of left elbow 04/12/2013  . Intra-abdominal abscess ([place]) 10/21/2015  . Intractable migraine with aura 12/25/2009  . Liver lesion 02/12/2018   Last Assessment & Plan:  She will be scheduled for an MRI/MRCP of the liver in the near future to better characterize this lesion.  While there are multiple benign possibilities, including hemangioma, adenoma, and focal nodular hyperplasia, it is concerning that this lesion was not seen on her prior chest CT in 2018.  Given her symptoms of worsening nausea and unintentional weight loss,the MRCP po  . Lumbar facet arthropathy 02/19/2016  . Lymph node enlargement    left  . Memory loss 06/11/2016  . Migraines 11/15/2014  . On home oxygen therapy 05/19/2015   3L continuous; initiated by Pulmonologist on 02/2015  . Pleuritic chest pain 02/10/2012  . PONV (postoperative nausea and vomiting)    nausea  . POTS (postural orthostatic tachycardia syndrome) 02/13/2014  . Recurrent major depressive disorder, in remission ([place]) 02/04/2014  . Sacroiliac joint pain 11/01/2016   Added automatically from request for surgery [id number]  . Sinus tachycardia 10/25/2018  . SOB (shortness of breath) 05/28/2016  . Wound seroma 04/30/2012    Past Surgical History:  Procedure Laterality Date  . BREAST BIOPSY  09/25/2011   Procedure: BREAST BIOPSY;  Surgeon: Stark Klein, MD;  Location: [facility];  Service: General;  Laterality: Bilateral;  Excision Bilateral Breast Masses  . CARDIAC CATHETERIZATION     04-10-2011  . [place] and 2001  . HERNIA REPAIR  age 46 or 62   IHR  . KNEE SURGERY  2003   right patella  . MASS EXCISION Left 03/24/2013   Procedure: EXCISION LEFT CHEST WALL MASS;  Surgeon: Stark Klein, MD;  Location: [place];  Service: General;  Laterality: Left;  Marland Kitchen MASTECTOMY  01/01/2012    bilateral    Current Medications: Current Meds  Medication Sig  . albuterol (PROVENTIL HFA;VENTOLIN HFA) 108 (90 BASE) MCG/ACT inhaler Inhale 2 puffs into the lungs as needed.   Marland Kitchen amitriptyline (ELAVIL) 50 MG tablet Take 50 mg by mouth daily.   Marland Kitchen EPINEPHrine (EPIPEN JR) 0.15 MG/0.3ML injection Inject 0.15 mg into the muscle as needed. Uses adult dose epi pen prn  . metoprolol tartrate (LOPRESSOR) 25 MG tablet Take 1 tablet (25 mg total) by mouth three (3) times a day (at 6am, noon and 6pm).  . OXYGEN Inhale 2 L into the lungs continuous.  . rivaroxaban (XARELTO) 20 MG TABS tablet Take 1 tablet by mouth daily.  . sertraline (ZOLOFT) 100 MG tablet Take 100 mg by mouth at bedtime.      Allergies:   Aspirin, Bee venom, Shellfish-derived products, Hydrocodone, Norco [hydrocodone-acetaminophen], Oxycodone-acetaminophen, Tape, Other, and Tegaderm ag  mesh [silver]   Social History   Socioeconomic History  . Marital status: Married    Spouse name: Not on file  . Number of children: Not on file  . Years of education: Not on file  . Highest education level: Not on file  Occupational History  . Not on file  Social Needs  . Financial resource strain: Not on file  . Food insecurity    Worry: Not on file    Inability: Not on file  . Transportation needs    Medical: Not on file    Non-medical: Not on file  Tobacco Use  . Smoking status: Never Smoker  . Smokeless tobacco: Never Used  Substance and Sexual Activity  . Alcohol use: No  . Drug use: No  . Sexual activity: Yes  Lifestyle  . Physical activity    Days per week: Not on file    Minutes per session: Not on file  . Stress: Not on file  Relationships  . Social Herbalist on phone: Not on file    Gets together: Not on file    Attends religious service: Not on file    Active member of club or organization: Not on file    Attends meetings of clubs or organizations: Not on file    Relationship status: Not on file   Other Topics Concern  . Not on file  Social History Narrative  . Not on file     Family History: The patient's family history includes Cancer in her maternal aunt and maternal grandmother; Cancer (age of onset: 15) in her cousin; Diabetes in her mother. She was adopted.  ROS:   Please see the history of present illness.    All other systems reviewed and are negative.  EKGs/Labs/Other Studies Reviewed:    The following studies were reviewed today:  Patient underwent Lexiscan sestamibi at [facility] and this revealed mild reversibility in the inferior distal segment suggesting ischemia and ejection fraction was normal.   IMPRESSIONS    1. Left ventricular ejection fraction, by visual estimation, is 45 to 50%. The left ventricle has mildly decreased function. Normal left ventricular size. There is no left ventricular hypertrophy. Normal Diastolic Function.  2. Global right ventricle has normal systolic function.The right ventricular size is normal. No increase in right ventricular wall thickness.  3. Left atrial size was normal.  4. Right atrial size was normal.  5. There is mild calcification of the posterior mitral annular. Trace mitral valve regurgitation. No evidence of mitral stenosis.  6. The tricuspid valve is normal in structure. Tricuspid valve regurgitation is trivial.  7. The aortic valve is normal in structure. Aortic valve regurgitation was not visualized by color flow Doppler. Structurally normal aortic valve, with no evidence of sclerosis or stenosis.  8. The pulmonic valve was not well visualized. Pulmonic valve regurgitation is trivial by color flow Doppler.  9. Normal pulmonary artery systolic pressure.    Recent Labs: 12/08/2018: BUN 9; Creatinine, Ser 0.67; Potassium 4.6; Sodium 142; TSH 1.030  Recent Lipid Panel No results found for: CHOL, TRIG, HDL, CHOLHDL, VLDL, LDLCALC, LDLDIRECT  Physical Exam:    VS:  BP 122/82 (BP Location: Left Arm,  Patient Position: Sitting, Cuff Size: Large)   Pulse 96   Ht 5\' 1"  (1.549 m)   Wt 247 lb (112 kg)   SpO2 99%   BMI 46.67 kg/m     Wt Readings from Last 3 Encounters:  08/20/19 247 lb (112  kg)  12/08/18 226 lb (102.5 kg)  12/01/13 200 lb (90.7 kg)     GEN: Patient is in no acute distress HEENT: Normal NECK: No JVD; No carotid bruits LYMPHATICS: No lymphadenopathy CARDIAC: Hear sounds regular, 2/6 systolic murmur at the apex. RESPIRATORY:  Clear to auscultation without rales, wheezing or rhonchi  ABDOMEN: Soft, non-tender, non-distended MUSCULOSKELETAL:  No edema; No deformity  SKIN: Warm and dry NEUROLOGIC:  Alert and oriented x 3 PSYCHIATRIC:  Normal affect   Signed, Jenean Lindau, MD  08/20/2019 11:24 AM    [place]

## 2019-08-20 NOTE — Progress Notes (Signed)
Cardiology Office Note:    Date:  08/20/2019   ID:  Tara Clarke, DOB [birth year], MRN [id number]  PCP:  Algis Greenhouse, MD  Cardiologist:  Jenean Lindau, MD   Referring MD: Algis Greenhouse, MD    ASSESSMENT:    1. Abnormal nuclear cardiac imaging test   2. SOB (shortness of breath)   3. Screening cholesterol level   4. Thyroid disorder screen    PLAN:    In order of problems listed above:  1. Abnormal nuclear stress test: I discussed my findings with the patient at extensive length and all questions were answered to her satisfaction.  I gave her the option of conventional coronary angiography and CT coronary angiography and she opted for the former.I discussed coronary angiography and left heart catheterization with the patient at extensive length. Procedure, benefits and potential risks were explained. Patient had multiple questions which were answered to the patient's satisfaction. Patient agreed and consented for the procedure. Further recommendations will be made based on the findings of the coronary angiography. In the interim. The patient has any significant symptoms he knows to go to the nearest emergency room. 2. Patient has had multiple history of deep venous thrombosis and pulmonary embolism and is very keen on undergoing bridging with Lovenox.  We respect his wishes and she will need to be bridged in we will plan for this.  Patient is allergic to aspirin and was therefore advised not to take this medication. 3. Findings of the echocardiogram were discussed with the patient at length.  Further recommendations will be made based on the findings of the coronary angiography.  She knows to go to the nearest emergency room for any concerning symptoms.   Medication Adjustments/Labs and Tests Ordered: Current medicines are reviewed at length with the patient today.  Concerns regarding medicines are outlined above.  Orders Placed This Encounter  Procedures  . CBC  . Basic  Metabolic Panel (BMET)  . TSH  . Hepatic function panel  . Lipid Profile   Meds ordered this encounter  Medications  . enoxaparin (LOVENOX) 30 MG/0.3ML injection    Sig: Inject 0.3 mLs (30 mg total) into the skin every 12 (twelve) hours.    Dispense:  30 mL    Refill:  4     No chief complaint on file.    History of Present Illness:    Tara Clarke is a 43 y.o. female..  Patient has history of chronic recurrent DVT and pulmonary embolism.  She underwent echocardiogram testing.  She has history of POTS disease.  She uses beta-blockers on a regular basis and is feeling fine.  No chest pain orthopnea or PND.  At the time of my evaluation, the patient is alert awake oriented and in no distress.  Past Medical History:  Diagnosis Date  . Abdominal wall hernia 04/03/2018  . Abnormal CT of liver 02/11/2018  . Anemia 08/08/2015  . Arthritis of both knees 02/04/2014  . Asthma    daily and prn inhalers  . Asthma 12/25/2009  . B12 deficiency 06/11/2016   2017: B12 170, MMA: 194 HB 12/89 2017: parenteral, did not increase with oral, has had bowel surgery  . Breast mass, right 1 cm 11 oclock 08/16/2011  . Bronchitis 05/11/2018  . Cancer ([place]) 12/2011   breast  . Chest wall mass 03/01/2013  . Chronic anticoagulation 08/10/2015   For recurrent DVT and PE off anticoagulation  . Chronic bilateral low back pain with left-sided  sciatica 02/19/2016  . Community acquired pneumonia 01/11/2012  . DDD (degenerative disc disease), lumbar 02/19/2016  . Depression   . Family history of breast cancer in mother 10/21/2011  . Fibroadenoma of breast, left side, biopsy proven. 08/16/2011  . Headache(784.0)    monthly with menses  . Heart palpitations   . History of bacterial pneumonia 02/11/2018  . History of DVT (deep vein thrombosis) 11/15/2014   RUE DVT/PEs 12/2013, completed anticoag tx x 6 months Recurrent RLE DVT/PE 11/2014  Last Assessment & Plan:  Relevant Hx: Course: Daily Update: Today's Plan:  Electronically  signed by: Earlie Server, MD 12/06/14 1401  . Hx pulmonary embolism 12/06/2014   PE x2 both provoked after surgery, 12/2013, 11/2014  . Infected sebaceous cyst of left elbow 04/12/2013  . Intra-abdominal abscess ([place]) 10/21/2015  . Intractable migraine with aura 12/25/2009  . Liver lesion 02/12/2018   Last Assessment & Plan:  She will be scheduled for an MRI/MRCP of the liver in the near future to better characterize this lesion.  While there are multiple benign possibilities, including hemangioma, adenoma, and focal nodular hyperplasia, it is concerning that this lesion was not seen on her prior chest CT in 2018.  Given her symptoms of worsening nausea and unintentional weight loss,the MRCP po  . Lumbar facet arthropathy 02/19/2016  . Lymph node enlargement    left  . Memory loss 06/11/2016  . Migraines 11/15/2014  . On home oxygen therapy 05/19/2015   3L continuous; initiated by Pulmonologist on 02/2015  . Pleuritic chest pain 02/10/2012  . PONV (postoperative nausea and vomiting)    nausea  . POTS (postural orthostatic tachycardia syndrome) 02/13/2014  . Recurrent major depressive disorder, in remission ([place]) 02/04/2014  . Sacroiliac joint pain 11/01/2016   Added automatically from request for surgery [id number]  . Sinus tachycardia 10/25/2018  . SOB (shortness of breath) 05/28/2016  . Wound seroma 04/30/2012    Past Surgical History:  Procedure Laterality Date  . BREAST BIOPSY  09/25/2011   Procedure: BREAST BIOPSY;  Surgeon: Stark Klein, MD;  Location: [facility];  Service: General;  Laterality: Bilateral;  Excision Bilateral Breast Masses  . CARDIAC CATHETERIZATION     04-10-2011  . [place] and 2001  . HERNIA REPAIR  age 80 or 68   IHR  . KNEE SURGERY  2003   right patella  . MASS EXCISION Left 03/24/2013   Procedure: EXCISION LEFT CHEST WALL MASS;  Surgeon: Stark Klein, MD;  Location: [place];  Service: General;  Laterality: Left;  Marland Kitchen MASTECTOMY  01/01/2012    bilateral    Current Medications: Current Meds  Medication Sig  . albuterol (PROVENTIL HFA;VENTOLIN HFA) 108 (90 BASE) MCG/ACT inhaler Inhale 2 puffs into the lungs as needed.   Marland Kitchen amitriptyline (ELAVIL) 50 MG tablet Take 50 mg by mouth daily.   Marland Kitchen EPINEPHrine (EPIPEN JR) 0.15 MG/0.3ML injection Inject 0.15 mg into the muscle as needed. Uses adult dose epi pen prn  . metoprolol tartrate (LOPRESSOR) 25 MG tablet Take 1 tablet (25 mg total) by mouth three (3) times a day (at 6am, noon and 6pm).  . OXYGEN Inhale 2 L into the lungs continuous.  . rivaroxaban (XARELTO) 20 MG TABS tablet Take 1 tablet by mouth daily.  . sertraline (ZOLOFT) 100 MG tablet Take 100 mg by mouth at bedtime.      Allergies:   Aspirin, Bee venom, Shellfish-derived products, Hydrocodone, Norco [hydrocodone-acetaminophen], Oxycodone-acetaminophen, Tape, Other, and Tegaderm ag  mesh [silver]   Social History   Socioeconomic History  . Marital status: Married    Spouse name: Not on file  . Number of children: Not on file  . Years of education: Not on file  . Highest education level: Not on file  Occupational History  . Not on file  Social Needs  . Financial resource strain: Not on file  . Food insecurity    Worry: Not on file    Inability: Not on file  . Transportation needs    Medical: Not on file    Non-medical: Not on file  Tobacco Use  . Smoking status: Never Smoker  . Smokeless tobacco: Never Used  Substance and Sexual Activity  . Alcohol use: No  . Drug use: No  . Sexual activity: Yes  Lifestyle  . Physical activity    Days per week: Not on file    Minutes per session: Not on file  . Stress: Not on file  Relationships  . Social Herbalist on phone: Not on file    Gets together: Not on file    Attends religious service: Not on file    Active member of club or organization: Not on file    Attends meetings of clubs or organizations: Not on file    Relationship status: Not on file   Other Topics Concern  . Not on file  Social History Narrative  . Not on file     Family History: The patient's family history includes Cancer in her maternal aunt and maternal grandmother; Cancer (age of onset: 108) in her cousin; Diabetes in her mother. She was adopted.  ROS:   Please see the history of present illness.    All other systems reviewed and are negative.  EKGs/Labs/Other Studies Reviewed:    The following studies were reviewed today:  Patient underwent Lexiscan sestamibi at [facility] and this revealed mild reversibility in the inferior distal segment suggesting ischemia and ejection fraction was normal.   IMPRESSIONS    1. Left ventricular ejection fraction, by visual estimation, is 45 to 50%. The left ventricle has mildly decreased function. Normal left ventricular size. There is no left ventricular hypertrophy. Normal Diastolic Function.  2. Global right ventricle has normal systolic function.The right ventricular size is normal. No increase in right ventricular wall thickness.  3. Left atrial size was normal.  4. Right atrial size was normal.  5. There is mild calcification of the posterior mitral annular. Trace mitral valve regurgitation. No evidence of mitral stenosis.  6. The tricuspid valve is normal in structure. Tricuspid valve regurgitation is trivial.  7. The aortic valve is normal in structure. Aortic valve regurgitation was not visualized by color flow Doppler. Structurally normal aortic valve, with no evidence of sclerosis or stenosis.  8. The pulmonic valve was not well visualized. Pulmonic valve regurgitation is trivial by color flow Doppler.  9. Normal pulmonary artery systolic pressure.    Recent Labs: 12/08/2018: BUN 9; Creatinine, Ser 0.67; Potassium 4.6; Sodium 142; TSH 1.030  Recent Lipid Panel No results found for: CHOL, TRIG, HDL, CHOLHDL, VLDL, LDLCALC, LDLDIRECT  Physical Exam:    VS:  BP 122/82 (BP Location: Left Arm,  Patient Position: Sitting, Cuff Size: Large)   Pulse 96   Ht 5\' 1"  (1.549 m)   Wt 247 lb (112 kg)   SpO2 99%   BMI 46.67 kg/m     Wt Readings from Last 3 Encounters:  08/20/19 247 lb (112  kg)  12/08/18 226 lb (102.5 kg)  12/01/13 200 lb (90.7 kg)     GEN: Patient is in no acute distress HEENT: Normal NECK: No JVD; No carotid bruits LYMPHATICS: No lymphadenopathy CARDIAC: Hear sounds regular, 2/6 systolic murmur at the apex. RESPIRATORY:  Clear to auscultation without rales, wheezing or rhonchi  ABDOMEN: Soft, non-tender, non-distended MUSCULOSKELETAL:  No edema; No deformity  SKIN: Warm and dry NEUROLOGIC:  Alert and oriented x 3 PSYCHIATRIC:  Normal affect   Signed, Jenean Lindau, MD  08/20/2019 11:24 AM    [place]

## 2019-08-20 NOTE — Patient Instructions (Addendum)
Medication Instructions:  Your physician recommends that you continue on your current medications as directed. Please refer to the Current Medication list given to you today.  *If you need a refill on your cardiac medications before your next appointment, please call your pharmacy*  Lab Work: Your physician recommends that you have a troponin, BMP, CBC, TSH, hepatic and lipid drawn today.   If you have labs (blood work) drawn today and your tests are completely normal, you will receive your results only by:  [place] (if you have MyChart) OR  A paper copy in the mail If you have any lab test that is abnormal or we need to change your treatment, we will call you to review the results.  Testing/Procedures: You had an EKG performed today  Your physician has requested that you have a cardiac catheterization. Cardiac catheterization is used to diagnose and/or treat various heart conditions. Doctors may recommend this procedure for a number of different reasons. The most common reason is to evaluate chest pain. Chest pain can be a symptom of coronary artery disease (CAD), and cardiac catheterization can show whether plaque is narrowing or blocking your heart's arteries. This procedure is also used to evaluate the valves, as well as measure the blood flow and oxygen levels in different parts of your heart. For further information please visit [email or web address]. Please follow instruction sheet, as given.   YOU are scheduled for cvd19 screening on 08/23/19 at 1:40 pm at [address], [place], [place]. Get in the pre screening line     [address] [place] [address] Dept: [phone] Loc: [place]  08/20/2019  You are scheduled for a Cardiac Catheterization on Friday, November 13 with Dr. Harrell Gave End.  1. Please arrive at the [facility] (Main  Entrance A) at [facility]: [place], [place] [address] at 5:30 AM (This time is two hours before your procedure to ensure your preparation). Free valet parking service is available.   Special note: Every effort is made to have your procedure done on time. Please understand that emergencies sometimes delay scheduled procedures.  2. Diet: Do not eat solid foods after midnight.  The patient may have clear liquids until 5am upon the day of the procedure.  3. Labs:NONE  4. Medication instructions in preparation for your procedure:   Contrast Allergy: No  Stop taking Xarelto (Rivaroxaban) on Wednesday, November 11.  You will take lovenox 0.59ml injection every 12 hours until your procedure on 08/27/19  On the morning of your procedure, take your Aspirin and any morning medicines NOT listed above.  You may use sips of water.  5. Plan for one night stay--bring personal belongings. 6. Bring a current list of your medications and current insurance cards. 7. You MUST have a responsible person to drive you home. 8. Someone MUST be with you the first 24 hours after you arrive home or your discharge will be delayed. 9. Please wear clothes that are easy to get on and off and wear slip-on shoes.  Thank you for allowing [place] to care for you!   -- [facility]     Follow-Up: At [facility], you and your health needs are our priority. As part of our continuing mission to provide you with exceptional heart care, we have created designated Provider Care Teams. These Care Teams include your primary Cardiologist (physician) and Advanced  Practice Providers (APPs -  Physician Assistants and Nurse Practitioners) who all work together to provide you with the care you need, when you need it.  Your next appointment:   1 month  The format for your next appointment:   In Person  Provider:   Jyl Heinz, MD  Other Instructions  Coronary  Angiogram With Stent Coronary angiogram with stent placement is a procedure to widen or open a narrow blood vessel of the heart (coronary artery). Arteries may become blocked by cholesterol buildup (plaques) in the lining of the wall. When a coronary artery becomes partially blocked, blood flow to that area decreases. This may lead to chest pain or a heart attack (myocardial infarction). A stent is a small piece of metal that looks like mesh or a spring. Stent placement may be done as treatment for a heart attack or right after a coronary angiogram in which a blocked artery is found. Let your health care provider know about:  Any allergies you have.  All medicines you are taking, including vitamins, herbs, eye drops, creams, and over-the-counter medicines.  Any problems you or family members have had with anesthetic medicines.  Any blood disorders you have.  Any surgeries you have had.  Any medical conditions you have.  Whether you are pregnant or may be pregnant. What are the risks? Generally, this is a safe procedure. However, problems may occur, including:  Damage to the heart or its blood vessels.  A return of blockage.  Bleeding, infection, or bruising at the insertion site.  A collection of blood under the skin (hematoma) at the insertion site.  A blood clot in another part of the body.  Kidney injury.  Allergic reaction to the dye or contrast that is used.  Bleeding into the abdomen (retroperitoneal bleeding). What happens before the procedure? Staying hydrated  Follow instructions from your health care provider about hydration, which may include:  Up to 2 hours before the procedure - you may continue to drink clear liquids, such as water, clear fruit juice, black coffee, and plain tea.  Eating and drinking restrictions Follow instructions from your health care provider about eating and drinking, which may include:  8 hours before the procedure - stop eating  heavy meals or foods such as meat, fried foods, or fatty foods.  6 hours before the procedure - stop eating light meals or foods, such as toast or cereal.  2 hours before the procedure - stop drinking clear liquids. Ask your health care provider about:  Changing or stopping your regular medicines. This is especially important if you are taking diabetes medicines or blood thinners.  Taking medicines such as ibuprofen. These medicines can thin your blood. Do not take these medicines before your procedure if your health care provider instructs you not to. Generally, aspirin is recommended before a procedure of passing a small, thin tube (catheter) through a blood vessel and into the heart (cardiac catheterization). What happens during the procedure?    An IV tube will be inserted into one of your veins.  You will be given one or more of the following: ? A medicine to help you relax (sedative). ? A medicine to numb the area where the catheter will be inserted into an artery (local anesthetic).  To reduce your risk of infection: ? Your health care team will wash or sanitize their hands. ? Your skin will be washed with soap. ? Hair may be removed from the area where the catheter will be inserted.  Using a guide wire, the catheter will be inserted into an artery. The location may be in your groin, in your wrist, or in the fold of your arm (near your elbow).  A type of X-ray (fluoroscopy) will be used to help guide the catheter to the opening of the arteries in the heart.  A dye will be injected into the catheter, and X-rays will be taken. The dye will help to show where any narrowing or blockages are located in the arteries.  A tiny wire will be guided to the blocked spot, and a balloon will be inflated to make the artery wider.  The stent will be expanded and will crush the plaques into the wall of the vessel. The stent will hold the area open and improve the blood flow. Most stents have  a drug coating to reduce the risk of the stent narrowing over time.  The artery may be made wider using a drill, laser, or other tools to remove plaques.  When the blood flow is better, the catheter will be removed. The lining of the artery will grow over the stent, which stays where it was placed. This procedure may vary among health care providers and hospitals. What happens after the procedure?  If the procedure is done through the leg, you will be kept in bed lying flat for about 6 hours. You will be instructed to not bend and not cross your legs.  The insertion site will be checked frequently.  The pulse in your foot or wrist will be checked frequently.  You may have additional blood tests, X-rays, and a test that records the electrical activity of your heart (electrocardiogram, or ECG). This information is not intended to replace advice given to you by your health care provider. Make sure you discuss any questions you have with your health care provider. Document Released: 04/06/2003 Document Revised: 01/09/2018 Document Reviewed: 05/05/2016 Elsevier Patient Education  2020 [employer].

## 2019-08-23 ENCOUNTER — Inpatient Hospital Stay (HOSPITAL_COMMUNITY): Admission: RE | Admit: 2019-08-23 | Payer: 59 | Source: Ambulatory Visit

## 2019-08-23 ENCOUNTER — Telehealth: Payer: Self-pay | Admitting: Cardiology

## 2019-08-23 MED ORDER — ENOXAPARIN SODIUM 30 MG/0.3ML ~~LOC~~ SOLN: 30.0000 mg | Freq: Two times a day (BID) | SUBCUTANEOUS | 0 refills | Status: DC

## 2019-08-23 NOTE — Telephone Encounter (Signed)
Has questions about her covid test that was rescheduled for tomorrow

## 2019-08-23 NOTE — Addendum Note (Signed)
Addended by: Beckey Rutter on: 08/23/2019 10:35 AM   Modules accepted: Orders

## 2019-08-23 NOTE — Addendum Note (Signed)
Addended by: Beckey Rutter on: 08/23/2019 01:05 PM   Modules accepted: Orders

## 2019-08-23 NOTE — Telephone Encounter (Signed)
Patient requested a change to her cvd19 screening time tomorrow.

## 2019-08-24 ENCOUNTER — Other Ambulatory Visit (HOSPITAL_COMMUNITY): Payer: 59

## 2019-08-24 ENCOUNTER — Other Ambulatory Visit (HOSPITAL_COMMUNITY)
Admission: RE | Admit: 2019-08-24 | Discharge: 2019-08-24 | Disposition: A | Discharge status: Home / Self Care | Payer: 59 | Source: Ambulatory Visit | Attending: Internal Medicine | Admitting: Internal Medicine

## 2019-08-24 DIAGNOSIS — Z01812 Encounter for preprocedural laboratory examination: Secondary | ICD-10-CM | POA: Insufficient documentation

## 2019-08-24 DIAGNOSIS — Z20828 Contact with and (suspected) exposure to other viral communicable diseases: Secondary | ICD-10-CM | POA: Diagnosis not present

## 2019-08-24 LAB — LIPID PANEL
Chol/HDL Ratio: 3.6 ratio (ref 0.0–4.4)
Cholesterol, Total: 211 mg/dL — ABNORMAL HIGH (ref 100–199)
HDL: 59 mg/dL (ref >39)
LDL Chol Calc (NIH): 133 mg/dL — ABNORMAL HIGH (ref 0–99)
Triglycerides: 108 mg/dL (ref 0–149)
VLDL Cholesterol Cal: 19 mg/dL (ref 5–40)

## 2019-08-24 LAB — TSH: TSH: 0.771 u[IU]/mL (ref 0.450–4.500)

## 2019-08-24 LAB — BASIC METABOLIC PANEL
BUN/Creatinine Ratio: 17 (ref 9–23)
BUN: 11 mg/dL (ref 6–24)
CO2: 26 mmol/L (ref 20–29)
Calcium: 9.1 mg/dL (ref 8.7–10.2)
Chloride: 103 mmol/L (ref 96–106)
Creatinine, Ser: 0.66 mg/dL (ref 0.57–1.00)
GFR calc Af Amer: 127 mL/min/{1.73_m2} (ref >59)
GFR calc non Af Amer: 110 mL/min/{1.73_m2} (ref >59)
Glucose: 88 mg/dL (ref 65–99)
Potassium: 4.5 mmol/L (ref 3.5–5.2)
Sodium: 142 mmol/L (ref 134–144)

## 2019-08-24 LAB — HEPATIC FUNCTION PANEL
ALT: 5 IU/L (ref 0–32)
AST: 12 IU/L (ref 0–40)
Albumin: 4 g/dL (ref 3.8–4.8)
Alkaline Phosphatase: 56 IU/L (ref 39–117)
Bilirubin Total: <0.2 mg/dL (ref 0.0–1.2)
Bilirubin, Direct: 0.05 mg/dL (ref 0.00–0.40)
Total Protein: 6.3 g/dL (ref 6.0–8.5)

## 2019-08-24 LAB — CBC
Hematocrit: 41.6 % (ref 34.0–46.6)
Hemoglobin: 12.9 g/dL (ref 11.1–15.9)
MCH: 29.7 pg (ref 26.6–33.0)
MCHC: 31 g/dL — ABNORMAL LOW (ref 31.5–35.7)
MCV: 96 fL (ref 79–97)
Platelets: 319 10*3/uL (ref 150–450)
RBC: 4.34 x10E6/uL (ref 3.77–5.28)
RDW: 12.2 % (ref 11.7–15.4)
WBC: 6.9 10*3/uL (ref 3.4–10.8)

## 2019-08-25 ENCOUNTER — Telehealth: Payer: Self-pay

## 2019-08-25 NOTE — Telephone Encounter (Signed)
Results relayed, copy sent to Dr. Garlon Hatchet

## 2019-08-25 NOTE — Telephone Encounter (Signed)
-----   Message from Jenean Lindau, MD sent at 08/24/2019  4:55 PM EST ----- Needs to diet better to lower cholesterol.  The results of the study is unremarkable. Please inform patient. I will discuss in detail at next appointment. Cc  primary care/referring physician Jenean Lindau, MD 08/24/2019 4:55 PM

## 2019-08-26 ENCOUNTER — Telehealth: Payer: Self-pay | Admitting: *Deleted

## 2019-08-26 LAB — NOVEL CORONAVIRUS, NAA (HOSP ORDER, SEND-OUT TO REF LAB; TAT 18-24 HRS): SARS-CoV-2, NAA: NOT DETECTED

## 2019-08-26 NOTE — Telephone Encounter (Signed)
Pt contacted pre-catheterization scheduled at [facility] for: Friday August 27, 2019 7:30 AM Verified arrival time and place: [place] [facility]) at: 5:30 AM  No solid food after midnight prior to cath, clear liquids until 5 AM day of procedure. Contrast allergy: no  Hold: Xarelto-pt took last dose 9 PM 08/25/19- pt states this was the instruction she was given. Lovenox-pt took lovenox around 8 AM this morning. Per Lenna Sciara, pharmacist- pt instructed to take her last dose lovenox around 8 PM this evening (08/26/19).  Dr End is aware and okay with this.   AM meds can be  taken pre-cath with sip of water. Pt was not instructed to take aspirin.  Pt states she has not taken aspirin since she was a child and it caused significant shortness of breath.  Confirmed patient has responsible adult to drive home post procedure and observe 24 hours after arriving home: yes  Currently, due to Covid-19 pandemic, only one support person will be allowed with patient. Must be the same support person for that patient's entire stay, will be screened and required to wear a mask. They will be asked to wait in the waiting room for the duration of the patient's stay.  Patients are required to wear a mask when they enter the hospital.      COVID-19 Pre-Screening Questions:  . In the past 7 to 10 days have you had a cough,  shortness of breath, headache, congestion, fever (100 or greater) body aches, chills, sore throat, or sudden loss of taste or sense of smell? no . Have you been around anyone with known Covid 19? no . Have you been around anyone who is awaiting Covid 19 test results in the past 7 to 10 days? no . Have you been around anyone who has been exposed to Covid 19, or has mentioned symptoms of Covid 19 within the past 7 to 10 days? no   I reviewed procedure/mask/visitor instructions, Covid-19 screening questions with patient, she verbalized understanding, thanked me for  call.

## 2019-08-27 ENCOUNTER — Ambulatory Visit (HOSPITAL_COMMUNITY)
Admission: RE | Admit: 2019-08-27 | Discharge: 2019-08-27 | Disposition: A | Discharge status: Home / Self Care | Payer: 59 | Attending: Internal Medicine | Admitting: Internal Medicine

## 2019-08-27 ENCOUNTER — Other Ambulatory Visit: Payer: Self-pay

## 2019-08-27 ENCOUNTER — Encounter (HOSPITAL_COMMUNITY)
Admission: RE | Disposition: A | Discharge status: Home / Self Care | Payer: Self-pay | Source: Home / Self Care | Attending: Internal Medicine

## 2019-08-27 ENCOUNTER — Encounter (HOSPITAL_COMMUNITY): Payer: Self-pay | Admitting: Internal Medicine

## 2019-08-27 DIAGNOSIS — Z79899 Other long term (current) drug therapy: Secondary | ICD-10-CM | POA: Insufficient documentation

## 2019-08-27 DIAGNOSIS — Z13220 Encounter for screening for lipoid disorders: Secondary | ICD-10-CM

## 2019-08-27 DIAGNOSIS — J45909 Unspecified asthma, uncomplicated: Secondary | ICD-10-CM | POA: Diagnosis not present

## 2019-08-27 DIAGNOSIS — R9431 Abnormal electrocardiogram [ECG] [EKG]: Secondary | ICD-10-CM | POA: Diagnosis not present

## 2019-08-27 DIAGNOSIS — Z7901 Long term (current) use of anticoagulants: Secondary | ICD-10-CM | POA: Diagnosis not present

## 2019-08-27 DIAGNOSIS — Z886 Allergy status to analgesic agent status: Secondary | ICD-10-CM | POA: Diagnosis not present

## 2019-08-27 DIAGNOSIS — R931 Abnormal findings on diagnostic imaging of heart and coronary circulation: Secondary | ICD-10-CM

## 2019-08-27 DIAGNOSIS — R9439 Abnormal result of other cardiovascular function study: Secondary | ICD-10-CM | POA: Diagnosis not present

## 2019-08-27 DIAGNOSIS — I429 Cardiomyopathy, unspecified: Secondary | ICD-10-CM

## 2019-08-27 DIAGNOSIS — Z86718 Personal history of other venous thrombosis and embolism: Secondary | ICD-10-CM | POA: Insufficient documentation

## 2019-08-27 DIAGNOSIS — Z86711 Personal history of pulmonary embolism: Secondary | ICD-10-CM | POA: Diagnosis not present

## 2019-08-27 DIAGNOSIS — Z1329 Encounter for screening for other suspected endocrine disorder: Secondary | ICD-10-CM

## 2019-08-27 DIAGNOSIS — R0602 Shortness of breath: Secondary | ICD-10-CM

## 2019-08-27 HISTORY — DX: Cardiomyopathy, unspecified: I42.9

## 2019-08-27 HISTORY — PX: LEFT HEART CATH AND CORONARY ANGIOGRAPHY: CATH118249

## 2019-08-27 LAB — PREGNANCY, URINE: Preg Test, Ur: NEGATIVE

## 2019-08-27 SURGERY — LEFT HEART CATH AND CORONARY ANGIOGRAPHY
Anesthesia: LOCAL

## 2019-08-27 MED ORDER — IOHEXOL 350 MG/ML SOLN
INTRAVENOUS | Status: DC | PRN
  Administered 2019-08-27: 35 mL via INTRA_ARTERIAL

## 2019-08-27 MED ORDER — NITROGLYCERIN 0.4 MG SL SUBL
SUBLINGUAL_TABLET | SUBLINGUAL | Status: AC
  Administered 2019-08-27: 09:00:00 0.4 mg
  Filled 2019-08-27: qty 1

## 2019-08-27 MED ORDER — HEPARIN (PORCINE) IN NACL 1000-0.9 UT/500ML-% IV SOLN
INTRAVENOUS | Status: AC
  Filled 2019-08-27: qty 1000

## 2019-08-27 MED ORDER — SODIUM CHLORIDE 0.9 % WEIGHT BASED INFUSION: 1.0000 mL/kg/h | INTRAVENOUS | Status: DC

## 2019-08-27 MED ORDER — SODIUM CHLORIDE 0.9 % IV SOLN: 250.0000 mL | INTRAVENOUS | Status: DC | PRN

## 2019-08-27 MED ORDER — VERAPAMIL HCL 2.5 MG/ML IV SOLN
INTRAVENOUS | Status: AC
  Filled 2019-08-27: qty 2

## 2019-08-27 MED ORDER — NITROGLYCERIN 1 MG/10 ML FOR IR/CATH LAB
INTRA_ARTERIAL | Status: AC
  Filled 2019-08-27: qty 10

## 2019-08-27 MED ORDER — LIDOCAINE HCL (PF) 1 % IJ SOLN
INTRAMUSCULAR | Status: AC
  Filled 2019-08-27: qty 30

## 2019-08-27 MED ORDER — MIDAZOLAM HCL 2 MG/2ML IJ SOLN
INTRAMUSCULAR | Status: AC
  Filled 2019-08-27: qty 2

## 2019-08-27 MED ORDER — HEPARIN (PORCINE) IN NACL 1000-0.9 UT/500ML-% IV SOLN
INTRAVENOUS | Status: DC | PRN
  Administered 2019-08-27 (×2): 500 mL

## 2019-08-27 MED ORDER — HEPARIN SODIUM (PORCINE) 1000 UNIT/ML IJ SOLN
INTRAMUSCULAR | Status: DC | PRN
  Administered 2019-08-27: 5000 [IU] via INTRAVENOUS

## 2019-08-27 MED ORDER — FENTANYL CITRATE (PF) 100 MCG/2ML IJ SOLN
INTRAMUSCULAR | Status: AC
  Filled 2019-08-27: qty 2

## 2019-08-27 MED ORDER — ONDANSETRON HCL 4 MG/2ML IJ SOLN: 4.0000 mg | Freq: Four times a day (QID) | INTRAMUSCULAR | Status: DC | PRN

## 2019-08-27 MED ORDER — NITROGLYCERIN 1 MG/10 ML FOR IR/CATH LAB
INTRA_ARTERIAL | Status: DC | PRN
  Administered 2019-08-27 (×2): 200 ug via INTRACORONARY

## 2019-08-27 MED ORDER — LIDOCAINE HCL (PF) 1 % IJ SOLN
INTRAMUSCULAR | Status: DC | PRN
  Administered 2019-08-27: 2 mL via INTRADERMAL

## 2019-08-27 MED ORDER — NITROGLYCERIN 0.4 MG SL SUBL
SUBLINGUAL_TABLET | SUBLINGUAL | Status: AC
  Administered 2019-08-27: 0.4 mg
  Filled 2019-08-27: qty 1

## 2019-08-27 MED ORDER — FENTANYL CITRATE (PF) 100 MCG/2ML IJ SOLN
INTRAMUSCULAR | Status: DC | PRN
  Administered 2019-08-27 (×2): 25 ug via INTRAVENOUS

## 2019-08-27 MED ORDER — HYDRALAZINE HCL 20 MG/ML IJ SOLN: 10.0000 mg | INTRAMUSCULAR | Status: DC | PRN

## 2019-08-27 MED ORDER — ACETAMINOPHEN 325 MG PO TABS: 650.0000 mg | ORAL_TABLET | ORAL | Status: DC | PRN

## 2019-08-27 MED ORDER — SODIUM CHLORIDE 0.9% FLUSH: 3.0000 mL | Freq: Two times a day (BID) | INTRAVENOUS | Status: DC

## 2019-08-27 MED ORDER — FUROSEMIDE 40 MG PO TABS: 40.0000 mg | ORAL_TABLET | Freq: Every day | ORAL | 2 refills | Status: DC

## 2019-08-27 MED ORDER — SODIUM CHLORIDE 0.9 % WEIGHT BASED INFUSION
3.0000 mL/kg/h | INTRAVENOUS | Status: AC
  Administered 2019-08-27: 07:00:00 3 mL/kg/h via INTRAVENOUS

## 2019-08-27 MED ORDER — HEPARIN SODIUM (PORCINE) 1000 UNIT/ML IJ SOLN
INTRAMUSCULAR | Status: AC
  Filled 2019-08-27: qty 1

## 2019-08-27 MED ORDER — LABETALOL HCL 5 MG/ML IV SOLN: 10.0000 mg | INTRAVENOUS | Status: DC | PRN

## 2019-08-27 MED ORDER — VERAPAMIL HCL 2.5 MG/ML IV SOLN
INTRAVENOUS | Status: DC | PRN
  Administered 2019-08-27: 10 mL via INTRA_ARTERIAL

## 2019-08-27 MED ORDER — SODIUM CHLORIDE 0.9% FLUSH: 3.0000 mL | INTRAVENOUS | Status: DC | PRN

## 2019-08-27 MED ORDER — NITROGLYCERIN 0.4 MG SL SUBL: 0.4000 mg | SUBLINGUAL_TABLET | SUBLINGUAL | 99 refills | Status: DC | PRN

## 2019-08-27 MED ORDER — MIDAZOLAM HCL 2 MG/2ML IJ SOLN
INTRAMUSCULAR | Status: DC | PRN
  Administered 2019-08-27 (×2): 1 mg via INTRAVENOUS

## 2019-08-27 SURGICAL SUPPLY — 11 items
CATH 5FR JL3.5 JR4 ANG PIG MP (CATHETERS) ×1 IMPLANT
DEVICE RAD COMP TR BAND LRG (VASCULAR PRODUCTS) ×1 IMPLANT
GLIDESHEATH SLEND A-KIT 6F 22G (SHEATH) ×1 IMPLANT
GUIDEWIRE INQWIRE 1.5J.035X260 (WIRE) IMPLANT
INQWIRE 1.5J .035X260CM (WIRE) ×2
KIT HEART LEFT (KITS) ×2 IMPLANT
PACK CARDIAC CATHETERIZATION (CUSTOM PROCEDURE TRAY) ×2 IMPLANT
SHEATH PROBE COVER 6X72 (BAG) ×1 IMPLANT
TRANSDUCER W/STOPCOCK (MISCELLANEOUS) ×2 IMPLANT
TUBING CIL FLEX 10 FLL-RA (TUBING) ×2 IMPLANT
WIRE HI TORQ VERSACORE-J 145CM (WIRE) ×1 IMPLANT

## 2019-08-27 NOTE — Progress Notes (Signed)
[facility]  Date: 08/27/19 Time: 8:59 AM  I was alerted that the patient has acute onset of chest pain shortly after I saw her in recovery.  She had 10/10, sharp chest pain with numbness in the left arm.  Pain has improved to 5/10 with SL NTG x 1.  EKG shows NSR with subtle non-specific ST/T changes.  Catheterization was notable for catheter-induced vasospasm of the proximal RCA.  Otherwise, there was no significant CAD.  We will plan to repeat SL NTG and EKG, as well as close post-catheterization monitoring.  Nelva Bush, MD [facility] Pager: [phone]

## 2019-08-27 NOTE — Interval H&P Note (Signed)
History and Physical Interval Note:  08/27/2019 7:18 AM  Tara Clarke  has presented today for cardiac catheterization, with the diagnosis of cardiomyopathy and abnormal nuclear study.  The various methods of treatment have been discussed with the patient and family. After consideration of risks, benefits and other options for treatment, the patient has consented to  Procedure(s): LEFT HEART CATH AND CORONARY ANGIOGRAPHY (N/A) as a surgical intervention.  The patient's history has been reviewed, patient examined, no change in status, stable for surgery.  I have reviewed the patient's chart and labs.  Questions were answered to the patient's satisfaction.    Cath Lab Visit (complete for each Cath Lab visit)  Clinical Evaluation Leading to the Procedure:   ACS: No.  Non-ACS:    Anginal Classification: CCS III  Anti-ischemic medical therapy: Minimal Therapy (1 class of medications)  Non-Invasive Test Results: Intermediate-risk stress test findings: cardiac mortality 1-3%/year  Prior CABG: No previous CABG  Emett Stapel

## 2019-08-27 NOTE — Progress Notes (Signed)
Pt c/o 10/10 chest pain and pressure. (L) arm numb and tingling.  Gwynne Edinger in room.  Pt's O2 increased to 6 L [place].  Stat EKG obtained. Dr End Notified.  Pt given NTG SL with easing of pain.VSS

## 2019-08-27 NOTE — Progress Notes (Signed)
Dr End here. Pt is feeling better with easing of pain.  (L) arm better and can feel hand now but tingling in fingers that is  getting better.  Second NTG given per Dr Saunders Revel

## 2019-08-27 NOTE — Progress Notes (Signed)
Dr End returned to assess pt.  Pt has been pain free since previous episode .  VSS

## 2019-08-27 NOTE — Progress Notes (Signed)
Pt states her pain has resolved now.  She states she feels back to her baseline.   VSS see flowsheets

## 2019-08-27 NOTE — Discharge Instructions (Signed)
Radial Site Care ° °This sheet gives you information about how to care for yourself after your procedure. Your health care provider may also give you more specific instructions. If you have problems or questions, contact your health care provider. °What can I expect after the procedure? °After the procedure, it is common to have: °· Bruising and tenderness at the catheter insertion area. °Follow these instructions at home: °Medicines °· Take over-the-counter and prescription medicines only as told by your health care provider. °Insertion site care °· Follow instructions from your health care provider about how to take care of your insertion site. Make sure you: °? Wash your hands with soap and water before you change your bandage (dressing). If soap and water are not available, use hand sanitizer. °? Change your dressing as told by your health care provider. °? Leave stitches (sutures), skin glue, or adhesive strips in place. These skin closures may need to stay in place for 2 weeks or longer. If adhesive strip edges start to loosen and curl up, you may trim the loose edges. Do not remove adhesive strips completely unless your health care provider tells you to do that. °· Check your insertion site every day for signs of infection. Check for: °? Redness, swelling, or pain. °? Fluid or blood. °? Pus or a bad smell. °? Warmth. °· Do not take baths, swim, or use a hot tub until your health care provider approves. °· You may shower 24-48 hours after the procedure, or as directed by your health care provider. °? Remove the dressing and gently wash the site with plain soap and water. °? Pat the area dry with a clean towel. °? Do not rub the site. That could cause bleeding. °· Do not apply powder or lotion to the site. °Activity ° °· For 24 hours after the procedure, or as directed by your health care provider: °? Do not flex or bend the affected arm. °? Do not push or pull heavy objects with the affected arm. °? Do not  drive yourself home from the hospital or clinic. You may drive 24 hours after the procedure unless your health care provider tells you not to. °? Do not operate machinery or power tools. °· Do not lift anything that is heavier than 10 lb (4.5 kg), or the limit that you are told, until your health care provider says that it is safe. °· Ask your health care provider when it is okay to: °? Return to work or school. °? Resume usual physical activities or sports. °? Resume sexual activity. °General instructions °· If the catheter site starts to bleed, raise your arm and put firm pressure on the site. If the bleeding does not stop, get help right away. This is a medical emergency. °· If you went home on the same day as your procedure, a responsible adult should be with you for the first 24 hours after you arrive home. °· Keep all follow-up visits as told by your health care provider. This is important. °Contact a health care provider if: °· You have a fever. °· You have redness, swelling, or yellow drainage around your insertion site. °Get help right away if: °· You have unusual pain at the radial site. °· The catheter insertion area swells very fast. °· The insertion area is bleeding, and the bleeding does not stop when you hold steady pressure on the area. °· Your arm or hand becomes pale, cool, tingly, or numb. °These symptoms may represent a serious problem   that is an emergency. Do not wait to see if the symptoms will go away. Get medical help right away. Call your local emergency services (911 in the [place] Do not drive yourself to the hospital. Summary  After the procedure, it is common to have bruising and tenderness at the site.  Follow instructions from your health care provider about how to take care of your radial site wound. Check the wound every day for signs of infection.  Do not lift anything that is heavier than 10 lb (4.5 kg), or the limit that you are told, until your health care provider says  that it is safe. This information is not intended to replace advice given to you by your health care provider. Make sure you discuss any questions you have with your health care provider. Document Released: 11/02/2010 Document Revised: 11/05/2017 Document Reviewed: 11/05/2017 Elsevier Patient Education  2020 [place]. Heart Failure, Diagnosis  Heart failure means that your heart is not able to pump blood in the right way. This makes it hard for your body to work well. Heart failure is usually a long-term (chronic) condition. You must take good care of yourself and follow your treatment plan from your doctor. What are the causes? This condition may be caused by:  High blood pressure.  Build up of cholesterol and fat in the arteries.  Heart attack. This injures the heart muscle.  Heart valves that do not open and close properly.  Damage of the heart muscle. This is also called cardiomyopathy.  Lung disease.  Abnormal heart rhythms. What increases the risk? The risk of heart failure goes up as a person ages. This condition is also more likely to develop in people who:  Are overweight.  Are female.  Smoke or chew tobacco.  Abuse alcohol or illegal drugs.  Have taken medicines that can damage the heart.  Have diabetes.  Have abnormal heart rhythms.  Have thyroid problems.  Have low blood counts (anemia). What are the signs or symptoms? Symptoms of this condition include:  Shortness of breath.  Coughing.  Swelling of the feet, ankles, legs, or belly.  Losing weight for no reason.  Trouble breathing.  Waking from sleep because of the need to sit up and get more air.  Rapid heartbeat.  Being very tired.  Feeling dizzy, or feeling like you may pass out (faint).  Having no desire to eat.  Feeling like you may vomit (nauseous).  Peeing (urinating) more at night.  Feeling confused. How is this treated?     This condition may be treated  with:  Medicines. These can be given to treat blood pressure and to make the heart muscles stronger.  Changes in your daily life. These may include eating a healthy diet, staying at a healthy body weight, quitting tobacco and illegal drug use, or doing exercises.  Surgery. Surgery can be done to open blocked valves, or to put devices in the heart, such as pacemakers.  A donor heart (heart transplant). You will receive a healthy heart from a donor. Follow these instructions at home:  Treat other conditions as told by your doctor. These may include high blood pressure, diabetes, thyroid disease, or abnormal heart rhythms.  Learn as much as you can about heart failure.  Get support as you need it.  Keep all follow-up visits as told by your doctor. This is important. Summary  Heart failure means that your heart is not able to pump blood in the right way.  This  condition is caused by high blood pressure, heart attack, or damage of the heart muscle.  Symptoms of this condition include shortness of breath and swelling of the feet, ankles, legs, or belly. You may also feel very tired or feel like you may vomit.  You may be treated with medicines, surgery, or changes in your daily life.  Treat other health conditions as told by your doctor. This information is not intended to replace advice given to you by your health care provider. Make sure you discuss any questions you have with your health care provider. Document Released: 07/09/2008 Document Revised: 12/18/2018 Document Reviewed: 12/18/2018 Elsevier Patient Education  [place]. Heart Failure Action Plan A heart failure action plan helps you understand what to do when you have symptoms of heart failure. Follow the plan that was created by you and your health care provider. Review your plan each time you visit your health care provider. Red zone These signs and symptoms mean you should get medical help right away:  You have  trouble breathing when resting.  You have a dry cough that is getting worse.  You have swelling or pain in your legs or abdomen that is getting worse.  You suddenly gain more than 2-3 lb (0.9-1.4 kg) in a day, or more than 5 lb (2.3 kg) in one week. This amount may be more or less depending on your condition.  You have trouble staying awake or you feel confused.  You have chest pain.  You do not have an appetite.  You pass out. If you experience any of these symptoms:  Call your local emergency services (911 in the [place] right away or seek help at the emergency department of the nearest hospital. Yellow zone These signs and symptoms mean your condition may be getting worse and you should make some changes:  You have trouble breathing when you are active or you need to sleep with extra pillows.  You have swelling in your legs or abdomen.  You gain 2-3 lb (0.9-1.4 kg) in one day, or 5 lb (2.3 kg) in one week. This amount may be more or less depending on your condition.  You get tired easily.  You have trouble sleeping.  You have a dry cough. If you experience any of these symptoms:  Contact your health care provider within the next day.  Your health care provider may adjust your medicines. Green zone These signs mean you are doing well and can continue what you are doing:  You do not have shortness of breath.  You have very little swelling or no new swelling.  Your weight is stable (no gain or loss).  You have a normal activity level.  You do not have chest pain or any other new symptoms. Follow these instructions at home:  Take over-the-counter and prescription medicines only as told by your health care provider.  Weigh yourself daily. Your target weight is __________ lb (__________ kg). ? Call your health care provider if you gain more than __________ lb (__________ kg) in a day, or more than __________ lb (__________ kg) in one week.  Eat a heart-healthy diet.  Work with a diet and nutrition specialist (dietitian) to create an eating plan that is best for you.  Keep all follow-up visits as told by your health care provider. This is important. Where to find more information  American Heart Association: www.heart.org Summary  Follow the action plan that was created by you and your health care provider.  Get help right away if you have any symptoms in the Red zone. This information is not intended to replace advice given to you by your health care provider. Make sure you discuss any questions you have with your health care provider. Document Released: 11/09/2016 Document Revised: 09/12/2017 Document Reviewed: 11/09/2016 Elsevier Patient Education  2020 [place]. Heart Failure Eating Plan Heart failure, also called congestive heart failure, occurs when your heart does not pump blood well enough to meet your body's needs for oxygen-rich blood. Heart failure is a long-term (chronic) condition. Living with heart failure can be challenging. However, following your health care provider's instructions about a healthy lifestyle and working with a diet and nutrition specialist (dietitian) to choose the right foods may help to improve your symptoms. What are tips for following this plan? Reading food labels  Check food labels for the amount of sodium per serving. Choose foods that have less than 140 mg (milligrams) of sodium in each serving.  Check food labels for the number of calories per serving. This is important if you need to limit your daily calorie intake to lose weight.  Check food labels for the serving size. If you eat more than one serving, you will be eating more sodium and calories than what is listed on the label.  Look for foods that are labeled as "sodium-free," "very low sodium," or "low sodium." ? Foods labeled as "reduced sodium" or "lightly salted" may still have more sodium than what is recommended for you. Cooking  Avoid adding  salt when cooking. Ask your health care provider or dietitian before using salt substitutes.  Season food with salt-free seasonings, spices, or herbs. Check the label of seasoning mixes to make sure they do not contain salt.  Cook with heart-healthy oils, such as olive, canola, soybean, or sunflower oil.  Do not fry foods. Cook foods using low-fat methods, such as baking, boiling, grilling, and broiling.  Limit unhealthy fats when cooking by: ? Removing the skin from poultry, such as chicken. ? Removing all visible fats from meats. ? Skimming the fat off from stews, soups, and gravies before serving them. Meal planning   Limit your intake of: ? Processed, canned, or pre-packaged foods. ? Foods that are high in trans fat, such as fried foods. ? Sweets, desserts, sugary drinks, and other foods with added sugar. ? Full-fat dairy products, such as whole milk.  Eat a balanced diet that includes: ? 4-5 servings of fruit each day and 4-5 servings of vegetables each day. At each meal, try to fill half of your plate with fruits and vegetables. ? Up to 6-8 servings of whole grains each day. ? Up to 2 servings of lean meat, poultry, or fish each day. One serving of meat is equal to 3 oz. This is about the same size as a deck of cards. ? 2 servings of low-fat dairy each day. ? Heart-healthy fats. Healthy fats called omega-3 fatty acids are found in foods such as flaxseed and cold-water fish like sardines, salmon, and mackerel.  Aim to eat 25-35 g (grams) of fiber a day. Foods that are high in fiber include apples, broccoli, carrots, beans, peas, and whole grains.  Do not add salt or condiments that contain salt (such as soy sauce) to foods before eating.  When eating at a restaurant, ask that your food be prepared with less salt or no salt, if possible.  Try to eat 2 or more vegetarian meals each week.  Eat more home-cooked  food and eat less restaurant, buffet, and fast food. General  information  Do not eat more than 2,300 mg of salt (sodium) a day. The amount of sodium that is recommended for you may be lower, depending on your condition.  Maintain a healthy body weight as directed. Ask your health care provider what a healthy weight is for you. ? Check your weight every day. ? Work with your health care provider and dietitian to make a plan that is right for you to lose weight or maintain your current weight.  Limit how much fluid you drink. Ask your health care provider or dietitian how much fluid you can have each day.  Limit or avoid alcohol as told by your health care provider or dietitian. Recommended foods The items listed may not be a complete list. Talk with your dietitian about what dietary choices are best for you. Fruits All fresh, frozen, and canned fruits. Dried fruits, such as raisins, prunes, and cranberries. Vegetables All fresh vegetables. Vegetables that are frozen without sauce or added salt. Low-sodium or sodium-free canned vegetables. Grains Bread with less than 80 mg of sodium per slice. Whole-wheat pasta, quinoa, and brown rice. Oats and oatmeal. Barley. [place]. Grits and cream of wheat. Whole-grain and whole-wheat cold cereal. Meats and other protein foods Lean cuts of meat. Skinless chicken and [place]. Fish with high omega-3 fatty acids, such as salmon, sardines, and other cold-water fishes. Eggs. Dried beans, peas, and edamame. Unsalted nuts and nut butters. Dairy Low-fat or nonfat (skim) milk and dried milk. Rice milk, soy milk, and almond milk. Low-fat or nonfat yogurt. Small amounts of reduced-sodium block cheese. Low-sodium cottage cheese. Fats and oils Olive, canola, soybean, flaxseed, or sunflower oil. Avocado. Sweets and desserts Apple sauce. Granola bars. Sugar-free pudding and gelatin. Frozen fruit bars. Seasoning and other foods Fresh and dried herbs. Lemon or lime juice. Vinegar. Low-sodium ketchup. Salt-free marinades, salad  dressings, sauces, and seasonings. The items listed above may not be a complete list of foods and beverages you can eat. Contact a dietitian for more information. Foods to avoid The items listed may not be a complete list. Talk with your dietitian about what dietary choices are best for you. Fruits Fruits that are dried with sodium-containing preservatives. Vegetables Canned vegetables. Frozen vegetables with sauce or seasonings. Creamed vegetables. [place] fries. Onion rings. Pickled vegetables and sauerkraut. Grains Bread with more than 80 mg of sodium per slice. Hot or cold cereal with more than 140 mg sodium per serving. Salted pretzels and crackers. Pre-packaged breadcrumbs. Bagels, croissants, and biscuits. Meats and other protein foods Ribs and chicken wings. Bacon, ham, pepperoni, bologna, salami, and packaged luncheon meats. Hot dogs, bratwurst, and sausage. Canned meat. Smoked meat and fish. Salted nuts and seeds. Dairy Whole milk, half-and-half, and cream. Buttermilk. Processed cheese, cheese spreads, and cheese curds. Regular cottage cheese. Feta cheese. Shredded cheese. String cheese. Fats and oils Butter, lard, shortening, ghee, and bacon fat. Canned and packaged gravies. Seasoning and other foods Onion salt, garlic salt, table salt, and sea salt. Marinades. Regular salad dressings. Relishes, pickles, and olives. Meat flavorings and tenderizers, and bouillon cubes. Horseradish, ketchup, and mustard. Worcestershire sauce. Teriyaki sauce, soy sauce (including reduced sodium). Hot sauce and Tabasco sauce. Steak sauce, fish sauce, oyster sauce, and cocktail sauce. Taco seasonings. Barbecue sauce. Tartar sauce. The items listed above may not be a complete list of foods and beverages you should avoid. Contact a dietitian for more information. Summary  A heart failure eating plan  includes changes that limit your intake of sodium and unhealthy fat, and it may help you lose weight or  maintain a healthy weight. Your health care provider may also recommend limiting how much fluid you drink.  Most people with heart failure should eat no more than 2,300 mg of salt (sodium) a day. The amount of sodium that is recommended for you may be lower, depending on your condition.  Contact your health care provider or dietitian before making any major changes to your diet. This information is not intended to replace advice given to you by your health care provider. Make sure you discuss any questions you have with your health care provider. Document Released: 02/14/2017 Document Revised: 11/26/2018 Document Reviewed: 02/14/2017 Elsevier Patient Education  [place].

## 2019-09-20 ENCOUNTER — Ambulatory Visit (INDEPENDENT_AMBULATORY_CARE_PROVIDER_SITE_OTHER): Payer: 59 | Admitting: Cardiology

## 2019-09-20 ENCOUNTER — Other Ambulatory Visit: Payer: Self-pay

## 2019-09-20 ENCOUNTER — Encounter: Payer: Self-pay | Admitting: Cardiology

## 2019-09-20 VITALS — BP 116/74 | HR 89 | Wt 250.8 lb

## 2019-09-20 DIAGNOSIS — I498 Other specified cardiac arrhythmias: Secondary | ICD-10-CM

## 2019-09-20 DIAGNOSIS — G90A Postural orthostatic tachycardia syndrome (POTS): Secondary | ICD-10-CM

## 2019-09-20 DIAGNOSIS — I201 Angina pectoris with documented spasm: Secondary | ICD-10-CM

## 2019-09-20 HISTORY — DX: Angina pectoris with documented spasm: I20.1

## 2019-09-20 NOTE — Patient Instructions (Addendum)
Medication Instructions:  Your physician recommends that you continue on your current medications as directed. Please refer to the Current Medication list given to you today.  *If you need a refill on your cardiac medications before your next appointment, please call your pharmacy*  Lab Work: Your physician recommends that you have a BMP drawn today  If you have labs (blood work) drawn today and your tests are completely normal, you will receive your results only by:  [place] (if you have MyChart) OR  A paper copy in the mail If you have any lab test that is abnormal or we need to change your treatment, we will call you to review the results.  Testing/Procedures: NONE  Follow-Up: At [facility], you and your health needs are our priority.  As part of our continuing mission to provide you with exceptional heart care, we have created designated Provider Care Teams.  These Care Teams include your primary Cardiologist (physician) and Advanced Practice Providers (APPs -  Physician Assistants and Nurse Practitioners) who all work together to provide you with the care you need, when you need it.  Your next appointment:   6 month(s)  The format for your next appointment:   In Person  Provider:   Jyl Heinz, MD  Other Instructions  Low-Sodium Eating Plan Sodium, which is an element that makes up salt, helps you maintain a healthy balance of fluids in your body. Too much sodium can increase your blood pressure and cause fluid and waste to be held in your body. Your health care provider or dietitian may recommend following this plan if you have high blood pressure (hypertension), kidney disease, liver disease, or heart failure. Eating less sodium can help lower your blood pressure, reduce swelling, and protect your heart, liver, and kidneys. What are tips for following this plan? General guidelines  Most people on this plan should limit their sodium intake to 1,500-2,000 mg  (milligrams) of sodium each day. Reading food labels   The Nutrition Facts label lists the amount of sodium in one serving of the food. If you eat more than one serving, you must multiply the listed amount of sodium by the number of servings.  Choose foods with less than 140 mg of sodium per serving.  Avoid foods with 300 mg of sodium or more per serving. Shopping  Look for lower-sodium products, often labeled as "low-sodium" or "no salt added."  Always check the sodium content even if foods are labeled as "unsalted" or "no salt added".  Buy fresh foods. ? Avoid canned foods and premade or frozen meals. ? Avoid canned, cured, or processed meats  Buy breads that have less than 80 mg of sodium per slice. Cooking  Eat more home-cooked food and less restaurant, buffet, and fast food.  Avoid adding salt when cooking. Use salt-free seasonings or herbs instead of table salt or sea salt. Check with your health care provider or pharmacist before using salt substitutes.  Cook with plant-based oils, such as canola, sunflower, or olive oil. Meal planning  When eating at a restaurant, ask that your food be prepared with less salt or no salt, if possible.  Avoid foods that contain MSG (monosodium glutamate). MSG is sometimes added to [place] food, bouillon, and some canned foods. What foods are recommended? The items listed may not be a complete list. Talk with your dietitian about what dietary choices are best for you. Grains Low-sodium cereals, including oats, puffed wheat and rice, and shredded wheat. Low-sodium crackers.  Unsalted rice. Unsalted pasta. Low-sodium bread. Whole-grain breads and whole-grain pasta. Vegetables Fresh or frozen vegetables. "No salt added" canned vegetables. "No salt added" tomato sauce and paste. Low-sodium or reduced-sodium tomato and vegetable juice. Fruits Fresh, frozen, or canned fruit. Fruit juice. Meats and other protein foods Fresh or frozen (no salt  added) meat, poultry, seafood, and fish. Low-sodium canned tuna and salmon. Unsalted nuts. Dried peas, beans, and lentils without added salt. Unsalted canned beans. Eggs. Unsalted nut butters. Dairy Milk. Soy milk. Cheese that is naturally low in sodium, such as ricotta cheese, fresh mozzarella, or Swiss cheese Low-sodium or reduced-sodium cheese. Cream cheese. Yogurt. Fats and oils Unsalted butter. Unsalted margarine with no trans fat. Vegetable oils such as canola or olive oils. Seasonings and other foods Fresh and dried herbs and spices. Salt-free seasonings. Low-sodium mustard and ketchup. Sodium-free salad dressing. Sodium-free light mayonnaise. Fresh or refrigerated horseradish. Lemon juice. Vinegar. Homemade, reduced-sodium, or low-sodium soups. Unsalted popcorn and pretzels. Low-salt or salt-free chips. What foods are not recommended? The items listed may not be a complete list. Talk with your dietitian about what dietary choices are best for you. Grains Instant hot cereals. Bread stuffing, pancake, and biscuit mixes. Croutons. Seasoned rice or pasta mixes. Noodle soup cups. Boxed or frozen macaroni and cheese. Regular salted crackers. Self-rising flour. Vegetables Sauerkraut, pickled vegetables, and relishes. Olives. [place] fries. Onion rings. Regular canned vegetables (not low-sodium or reduced-sodium). Regular canned tomato sauce and paste (not low-sodium or reduced-sodium). Regular tomato and vegetable juice (not low-sodium or reduced-sodium). Frozen vegetables in sauces. Meats and other protein foods Meat or fish that is salted, canned, smoked, spiced, or pickled. Bacon, ham, sausage, hotdogs, corned beef, chipped beef, packaged lunch meats, salt pork, jerky, pickled herring, anchovies, regular canned tuna, sardines, salted nuts. Dairy Processed cheese and cheese spreads. Cheese curds. Blue cheese. Feta cheese. String cheese. Regular cottage cheese. Buttermilk. Canned milk. Fats and  oils Salted butter. Regular margarine. Ghee. Bacon fat. Seasonings and other foods Onion salt, garlic salt, seasoned salt, table salt, and sea salt. Canned and packaged gravies. Worcestershire sauce. Tartar sauce. Barbecue sauce. Teriyaki sauce. Soy sauce, including reduced-sodium. Steak sauce. Fish sauce. Oyster sauce. Cocktail sauce. Horseradish that you find on the shelf. Regular ketchup and mustard. Meat flavorings and tenderizers. Bouillon cubes. Hot sauce and Tabasco sauce. Premade or packaged marinades. Premade or packaged taco seasonings. Relishes. Regular salad dressings. Salsa. Potato and tortilla chips. Corn chips and puffs. Salted popcorn and pretzels. Canned or dried soups. Pizza. Frozen entrees and pot pies. Summary  Eating less sodium can help lower your blood pressure, reduce swelling, and protect your heart, liver, and kidneys.  Most people on this plan should limit their sodium intake to 1,500-2,000 mg (milligrams) of sodium each day.  Canned, boxed, and frozen foods are high in sodium. Restaurant foods, fast foods, and pizza are also very high in sodium. You also get sodium by adding salt to food.  Try to cook at home, eat more fresh fruits and vegetables, and eat less fast food, canned, processed, or prepared foods. This information is not intended to replace advice given to you by your health care provider. Make sure you discuss any questions you have with your health care provider. Document Released: 03/22/2002 Document Revised: 09/12/2017 Document Reviewed: 09/23/2016 Elsevier Patient Education  2020 [place] DASH stands for "Dietary Approaches to Stop Hypertension." The DASH eating plan is a healthy eating plan that has been shown to reduce high  blood pressure (hypertension). It may also reduce your risk for type 2 diabetes, heart disease, and stroke. The DASH eating plan may also help with weight loss. What are tips for following this  plan?  General guidelines  Avoid eating more than 2,300 mg (milligrams) of salt (sodium) a day. If you have hypertension, you may need to reduce your sodium intake to 1,500 mg a day.  Limit alcohol intake to no more than 1 drink a day for nonpregnant women and 2 drinks a day for men. One drink equals 12 oz of beer, 5 oz of wine, or 1 oz of hard liquor.  Work with your health care provider to maintain a healthy body weight or to lose weight. Ask what an ideal weight is for you.  Get at least 30 minutes of exercise that causes your heart to beat faster (aerobic exercise) most days of the week. Activities may include walking, swimming, or biking.  Work with your health care provider or diet and nutrition specialist (dietitian) to adjust your eating plan to your individual calorie needs. Reading food labels   Check food labels for the amount of sodium per serving. Choose foods with less than 5 percent of the Daily Value of sodium. Generally, foods with less than 300 mg of sodium per serving fit into this eating plan.  To find whole grains, look for the word "whole" as the first word in the ingredient list. Shopping  Buy products labeled as "low-sodium" or "no salt added."  Buy fresh foods. Avoid canned foods and premade or frozen meals. Cooking  Avoid adding salt when cooking. Use salt-free seasonings or herbs instead of table salt or sea salt. Check with your health care provider or pharmacist before using salt substitutes.  Do not fry foods. Cook foods using healthy methods such as baking, boiling, grilling, and broiling instead.  Cook with heart-healthy oils, such as olive, canola, soybean, or sunflower oil. Meal planning  Eat a balanced diet that includes: ? 5 or more servings of fruits and vegetables each day. At each meal, try to fill half of your plate with fruits and vegetables. ? Up to 6-8 servings of whole grains each day. ? Less than 6 oz of lean meat, poultry, or fish  each day. A 3-oz serving of meat is about the same size as a deck of cards. One egg equals 1 oz. ? 2 servings of low-fat dairy each day. ? A serving of nuts, seeds, or beans 5 times each week. ? Heart-healthy fats. Healthy fats called Omega-3 fatty acids are found in foods such as flaxseeds and coldwater fish, like sardines, salmon, and mackerel.  Limit how much you eat of the following: ? Canned or prepackaged foods. ? Food that is high in trans fat, such as fried foods. ? Food that is high in saturated fat, such as fatty meat. ? Sweets, desserts, sugary drinks, and other foods with added sugar. ? Full-fat dairy products.  Do not salt foods before eating.  Try to eat at least 2 vegetarian meals each week.  Eat more home-cooked food and less restaurant, buffet, and fast food.  When eating at a restaurant, ask that your food be prepared with less salt or no salt, if possible. What foods are recommended? The items listed may not be a complete list. Talk with your dietitian about what dietary choices are best for you. Grains Whole-grain or whole-wheat bread. Whole-grain or whole-wheat pasta. Brown rice. [place]. Bulgur. Whole-grain and low-sodium cereals. Pita  bread. Low-fat, low-sodium crackers. Whole-wheat flour tortillas. Vegetables Fresh or frozen vegetables (raw, steamed, roasted, or grilled). Low-sodium or reduced-sodium tomato and vegetable juice. Low-sodium or reduced-sodium tomato sauce and tomato paste. Low-sodium or reduced-sodium canned vegetables. Fruits All fresh, dried, or frozen fruit. Canned fruit in natural juice (without added sugar). Meat and other protein foods Skinless chicken or [place]. Ground chicken or [place]. Pork with fat trimmed off. Fish and seafood. Egg whites. Dried beans, peas, or lentils. Unsalted nuts, nut butters, and seeds. Unsalted canned beans. Lean cuts of beef with fat trimmed off. Low-sodium, lean deli meat. Dairy Low-fat (1%) or fat-free  (skim) milk. Fat-free, low-fat, or reduced-fat cheeses. Nonfat, low-sodium ricotta or cottage cheese. Low-fat or nonfat yogurt. Low-fat, low-sodium cheese. Fats and oils Soft margarine without trans fats. Vegetable oil. Low-fat, reduced-fat, or light mayonnaise and salad dressings (reduced-sodium). Canola, safflower, olive, soybean, and sunflower oils. Avocado. Seasoning and other foods Herbs. Spices. Seasoning mixes without salt. Unsalted popcorn and pretzels. Fat-free sweets. What foods are not recommended? The items listed may not be a complete list. Talk with your dietitian about what dietary choices are best for you. Grains Baked goods made with fat, such as croissants, muffins, or some breads. Dry pasta or rice meal packs. Vegetables Creamed or fried vegetables. Vegetables in a cheese sauce. Regular canned vegetables (not low-sodium or reduced-sodium). Regular canned tomato sauce and paste (not low-sodium or reduced-sodium). Regular tomato and vegetable juice (not low-sodium or reduced-sodium). Angie Fava. Olives. Fruits Canned fruit in a light or heavy syrup. Fried fruit. Fruit in cream or butter sauce. Meat and other protein foods Fatty cuts of meat. Ribs. Fried meat. [place] Salines. Sausage. Bologna and other processed lunch meats. Salami. Fatback. Hotdogs. Bratwurst. Salted nuts and seeds. Canned beans with added salt. Canned or smoked fish. Whole eggs or egg yolks. Chicken or [place] with skin. Dairy Whole or 2% milk, cream, and half-and-half. Whole or full-fat cream cheese. Whole-fat or sweetened yogurt. Full-fat cheese. Nondairy creamers. Whipped toppings. Processed cheese and cheese spreads. Fats and oils Butter. Stick margarine. Lard. Shortening. Ghee. Bacon fat. Tropical oils, such as coconut, palm kernel, or palm oil. Seasoning and other foods Salted popcorn and pretzels. Onion salt, garlic salt, seasoned salt, table salt, and sea salt. Worcestershire sauce. Tartar sauce. Barbecue sauce.  Teriyaki sauce. Soy sauce, including reduced-sodium. Steak sauce. Canned and packaged gravies. Fish sauce. Oyster sauce. Cocktail sauce. Horseradish that you find on the shelf. Ketchup. Mustard. Meat flavorings and tenderizers. Bouillon cubes. Hot sauce and Tabasco sauce. Premade or packaged marinades. Premade or packaged taco seasonings. Relishes. Regular salad dressings. Where to find more information:  National Heart, [employer], and [place]: https://wilson-eaton.com/  American Heart Association: www.heart.org Summary  The DASH eating plan is a healthy eating plan that has been shown to reduce high blood pressure (hypertension). It may also reduce your risk for type 2 diabetes, heart disease, and stroke.  With the DASH eating plan, you should limit salt (sodium) intake to 2,300 mg a day. If you have hypertension, you may need to reduce your sodium intake to 1,500 mg a day.  When on the DASH eating plan, aim to eat more fresh fruits and vegetables, whole grains, lean proteins, low-fat dairy, and heart-healthy fats.  Work with your health care provider or diet and nutrition specialist (dietitian) to adjust your eating plan to your individual calorie needs. This information is not intended to replace advice given to you by your health care provider. Make sure you discuss any questions you  have with your health care provider. Document Released: 09/19/2011 Document Revised: 09/12/2017 Document Reviewed: 09/23/2016 Elsevier Patient Education  2020 [employer].

## 2019-09-20 NOTE — Progress Notes (Signed)
Cardiology Office Note:    Date:  09/20/2019   ID:  Tara Clarke, DOB 1975-11-06, MRN [id number]  PCP:  Tara Greenhouse, MD  Cardiologist:  Tara Lindau, MD   Referring MD: Tara Greenhouse, MD    ASSESSMENT:    No diagnosis found. PLAN:    In order of problems listed above:  1. Chest tightness: Coronary angiography report has been reassuring.  She has been given nitroglycerin for chest tightness which may be related to coronary spasm.  It is helping her.  She also is taking diuretic and this is helping her.  There is elevated left ventricular pressures.  She will have a Chem-7 today.  Diet was discussed salt intake issues were discussed with her at extensive length and questions were answered to her satisfaction.  Coronary angiography report was also revisited in detail. 2. History of pulmonary embolism: Patient is on anticoagulation managed by primary care physician. 3. Patient will be seen in follow-up appointment in 6 months or earlier if the patient has any concerns    Medication Adjustments/Labs and Tests Ordered: Current medicines are reviewed at length with the patient today.  Concerns regarding medicines are outlined above.  No orders of the defined types were placed in this encounter.  No orders of the defined types were placed in this encounter.    Chief Complaint  Patient presents with  . Follow-up     History of Present Illness:    Tara Clarke is a 43 y.o. female.  [name]  For abnormal stress test she underwent coronary angiography and was found to have normal coronary arteries.  Spasm was identified and therefore she was sent home on a prescription of nitroglycerin as needed.  Positive murmur left ventricular end-diastolic pressures were elevated and she was given diuretic and she feels much better. Is  Past Medical History:  Diagnosis Date  . Abdominal wall hernia 04/03/2018   . Abnormal CT of liver 02/11/2018  . Anemia 08/08/2015  . Arthritis of both knees 02/04/2014  . Asthma    daily and prn inhalers  . Asthma 12/25/2009  . B12 deficiency 06/11/2016   2017: B12 170, MMA: 194 HB 12/89 2017: parenteral, did not increase with oral, has had bowel surgery  . Breast mass, right 1 cm 11 oclock 08/16/2011  . Bronchitis 05/11/2018  . Cancer ([place]) 12/2011   breast  . Chest wall mass 03/01/2013  . Chronic anticoagulation 08/10/2015   For recurrent DVT and PE off anticoagulation  . Chronic bilateral low back pain with left-sided sciatica 02/19/2016  . Community acquired pneumonia 01/11/2012  . DDD (degenerative disc disease), lumbar 02/19/2016  . Depression   . Family history of breast cancer in mother 10/21/2011  . Fibroadenoma of breast, left side, biopsy proven. 08/16/2011  . Headache(784.0)    monthly with menses  . Heart palpitations   . History of bacterial pneumonia 02/11/2018  . 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 Hx pulmonary embolism 12/06/2014   PE x2 both provoked after surgery, 12/2013, 11/2014  . Infected sebaceous cyst of left elbow 04/12/2013  . Intra-abdominal abscess ([place]) 10/21/2015  . Intractable migraine with aura 12/25/2009  . Liver  lesion 02/12/2018   Last Assessment & Plan:  She will be scheduled for an MRI/MRCP of the liver in the near future to better characterize this lesion.  While there are multiple benign possibilities, including hemangioma, adenoma, and focal nodular hyperplasia, it is concerning that this lesion was not seen on her prior chest CT in 2018.  Given her symptoms of worsening nausea and unintentional weight loss,the MRCP po  . Lumbar facet arthropathy 02/19/2016  . Lymph node enlargement    left  . Memory loss  06/11/2016  . Migraines 11/15/2014  . On home oxygen therapy 05/19/2015   3L continuous; initiated by Pulmonologist on 02/2015  . Pleuritic chest pain 02/10/2012  . PONV (postoperative nausea and vomiting)    nausea  . POTS (postural orthostatic tachycardia syndrome) 02/13/2014  . Recurrent major depressive disorder, in remission ([place]) 02/04/2014  . Sacroiliac joint pain 11/01/2016   Added automatically from request for surgery [phone]  . Sinus tachycardia 10/25/2018  . SOB (shortness of breath) 05/28/2016  . Wound seroma 04/30/2012    Past Surgical History:  Procedure Laterality Date  . BREAST BIOPSY  09/25/2011   Procedure: BREAST BIOPSY;  Surgeon: Stark Klein, MD;  Location: [facility];  Service: General;  Laterality: Bilateral;  Excision Bilateral Breast Masses  . CARDIAC CATHETERIZATION     04-10-2011  . [place] and 2001  . HERNIA REPAIR  age 47 or 27   IHR  . KNEE SURGERY  2003   right patella  . LEFT HEART CATH AND CORONARY ANGIOGRAPHY N/A 08/27/2019   Procedure: LEFT HEART CATH AND CORONARY ANGIOGRAPHY;  Surgeon: Nelva Bush, MD;  Location: [place] [facility];  Service: Cardiovascular;  Laterality: N/A;  . MASS EXCISION Left 03/24/2013   Procedure: EXCISION LEFT CHEST WALL MASS;  Surgeon: Stark Klein, MD;  Location: [place];  Service: General;  Laterality: Left;  Marland Kitchen MASTECTOMY  01/01/2012   bilateral    Current Medications: Current Meds  Medication Sig  . albuterol (PROVENTIL HFA;VENTOLIN HFA) 108 (90 BASE) MCG/ACT inhaler Inhale 2 puffs into the lungs as needed.   Marland Kitchen amitriptyline (ELAVIL) 50 MG tablet Take 50 mg by mouth at bedtime.   Marland Kitchen EPINEPHrine (EPIPEN JR) 0.15 MG/0.3ML injection Inject 0.15 mg into the muscle as needed. Uses adult dose epi pen prn  . furosemide (LASIX) 40 MG tablet Take 1 tablet (40 mg total) by mouth daily.  . metoprolol tartrate (LOPRESSOR) 25 MG tablet Take 1 tablet (25 mg total) by mouth three (3) times a day (at 6am, noon  and 6pm). (Patient taking differently: Take 25 mg by mouth 3 (three) times daily. )  . nitroGLYCERIN (NITROSTAT) 0.4 MG SL tablet Place 1 tablet (0.4 mg total) under the tongue every 5 (five) minutes as needed for chest pain.  . OXYGEN Inhale 2.5 L into the lungs continuous.   . rivaroxaban (XARELTO) 20 MG TABS tablet Take 20 mg by mouth at bedtime.   . sertraline (ZOLOFT) 100 MG tablet Take 100 mg by mouth at bedtime.      Allergies:   Aspirin, Bee venom, Shellfish-derived products, Hydrocodone, Norco [hydrocodone-acetaminophen], Oxycodone-acetaminophen, Tape, Other, and Tegaderm ag mesh [silver]   Social History   Socioeconomic History  . Marital status: Married    Spouse name: Not on file  . Number of children: Not on file  . Years of education: Not on file  . Highest education level: Not on file  Occupational History  . Not on  file  Social Needs  . Financial resource strain: Not on file  . Food insecurity    Worry: Not on file    Inability: Not on file  . Transportation needs    Medical: Not on file    Non-medical: Not on file  Tobacco Use  . Smoking status: Never Smoker  . Smokeless tobacco: Never Used  Substance and Sexual Activity  . Alcohol use: No  . Drug use: No  . Sexual activity: Yes  Lifestyle  . Physical activity    Days per week: Not on file    Minutes per session: Not on file  . Stress: Not on file  Relationships  . Social Herbalist on phone: Not on file    Gets together: Not on file    Attends religious service: Not on file    Active member of club or organization: Not on file    Attends meetings of clubs or organizations: Not on file    Relationship status: Not on file  Other Topics Concern  . Not on file  Social History Narrative  . Not on file     Family History: The patient's family history includes Cancer in her maternal aunt and maternal grandmother; Cancer (age of onset: 41) in her cousin; Diabetes in her mother. She was  adopted.  ROS:   Please see the history of present illness.    All other systems reviewed and are negative.  EKGs/Labs/Other Studies Reviewed:    The following studies were reviewed today: Study date: 08/27/19  Physicians  Panel Physicians Referring Physician Case Authorizing Physician  End, Harrell Gave, MD (Primary)    Procedures  LEFT HEART CATH AND CORONARY ANGIOGRAPHY  Conclusion  Conclusions: 1. No angiographically significant atherosclerosis coronary artery disease.  Catheter-induced vasospasm of the proximal RCA was noted; this resolved with intracoronary nitroglycerin. 2. Moderately elevated left ventricular filling pressure.  Recommendations: 1. Primary prevention of coronary artery disease. 2. Initiate furosemide 40 mg daily. 3. If no evidence of bleeding at catheterization site, restart bridging enoxaparin tomorrow morning and rivaroxaban tomorrow evening. 4. Optimization of evidence-based heart failure therapy for treatment of nonischemic cardiomyopathy per Dr. Geraldo Pitter.  Nelva Bush, MD [facility] Pager: [phone]      Recent Labs: 08/20/2019: ALT 5; BUN 11; Creatinine, Ser 0.66; Hemoglobin 12.9; Platelets 319; Potassium 4.5; Sodium 142; TSH 0.771  Recent Lipid Panel    Component Value Date/Time   CHOL 211 (H) 08/20/2019 1125   TRIG 108 08/20/2019 1125   HDL 59 08/20/2019 1125   CHOLHDL 3.6 08/20/2019 1125   LDLCALC 133 (H) 08/20/2019 1125    Physical Exam:    VS:  BP 116/74   Pulse 89   Wt 250 lb 12.8 oz (113.8 kg)   SpO2 98%   BMI 47.39 kg/m     Wt Readings from Last 3 Encounters:  09/20/19 250 lb 12.8 oz (113.8 kg)  08/27/19 245 lb (111.1 kg)  08/20/19 247 lb (112 kg)     GEN: Patient is in no acute distress HEENT: Normal NECK: No JVD; No carotid bruits LYMPHATICS: No lymphadenopathy CARDIAC: Hear sounds regular, 2/6 systolic murmur at the apex. RESPIRATORY:  Clear to auscultation without rales, wheezing or rhonchi   ABDOMEN: Soft, non-tender, non-distended MUSCULOSKELETAL:  No edema; No deformity  SKIN: Warm and dry NEUROLOGIC:  Alert and oriented x 3 PSYCHIATRIC:  Normal affect   Signed, Tara Lindau, MD  09/20/2019 9:48 AM    [facility]

## 2019-09-21 LAB — BASIC METABOLIC PANEL
BUN/Creatinine Ratio: 18 (ref 9–23)
BUN: 12 mg/dL (ref 6–24)
CO2: 28 mmol/L (ref 20–29)
Calcium: 9.1 mg/dL (ref 8.7–10.2)
Chloride: 100 mmol/L (ref 96–106)
Creatinine, Ser: 0.68 mg/dL (ref 0.57–1.00)
GFR calc Af Amer: 126 mL/min/{1.73_m2} (ref >59)
GFR calc non Af Amer: 109 mL/min/{1.73_m2} (ref >59)
Glucose: 86 mg/dL (ref 65–99)
Potassium: 4.7 mmol/L (ref 3.5–5.2)
Sodium: 141 mmol/L (ref 134–144)

## 2019-09-30 ENCOUNTER — Ambulatory Visit: Payer: 59 | Admitting: Cardiology

## 2019-10-04 ENCOUNTER — Telehealth: Payer: Self-pay

## 2019-10-04 NOTE — Telephone Encounter (Signed)
-----   Message from Jenean Lindau, MD sent at 09/21/2019  8:44 AM EST ----- The results of the study is unremarkable. Please inform patient. I will discuss in detail at next appointment. Cc  primary care/referring physician Jenean Lindau, MD 09/21/2019 8:44 AM

## 2019-10-04 NOTE — Telephone Encounter (Signed)
Left message for patient to call office for results, copy sent to Dr. Garlon Hatchet

## 2019-10-12 NOTE — Telephone Encounter (Signed)
Results relayed, no further questions. 

## 2019-12-21 ENCOUNTER — Other Ambulatory Visit: Payer: Self-pay | Admitting: Cardiology

## 2020-02-01 ENCOUNTER — Other Ambulatory Visit: Payer: Self-pay | Admitting: Cardiology

## 2020-02-01 MED ORDER — FUROSEMIDE 40 MG PO TABS: 40.0000 mg | ORAL_TABLET | Freq: Every day | ORAL | 2 refills | Status: DC

## 2020-02-01 NOTE — Telephone Encounter (Signed)
Spoke with patient and let her know that the refill has been sent in.

## 2020-02-01 NOTE — Telephone Encounter (Signed)
  *  STAT* If patient is at the pharmacy, call can be transferred to refill team.   1. Which medications need to be refilled? (please list name of each medication and dose if known) furosemide (LASIX) 40 MG tablet  2. Which pharmacy/location (including street and city if local pharmacy) is medication to be sent to? [facility] [place], [place]  3. Do they need a 30 day or 90 day supply? 90 days  Pt is about to ran out in a week

## 2020-03-28 ENCOUNTER — Ambulatory Visit: Payer: 59 | Admitting: Cardiology

## 2020-04-14 ENCOUNTER — Ambulatory Visit: Payer: 59 | Admitting: Cardiology

## 2020-04-14 ENCOUNTER — Other Ambulatory Visit: Payer: Self-pay

## 2020-04-14 ENCOUNTER — Encounter: Payer: Self-pay | Admitting: Cardiology

## 2020-04-14 VITALS — BP 118/70 | HR 92 | Ht 61.0 in | Wt 246.0 lb

## 2020-04-14 DIAGNOSIS — I429 Cardiomyopathy, unspecified: Secondary | ICD-10-CM

## 2020-04-14 DIAGNOSIS — I201 Angina pectoris with documented spasm: Secondary | ICD-10-CM

## 2020-04-14 DIAGNOSIS — Z9981 Dependence on supplemental oxygen: Secondary | ICD-10-CM

## 2020-04-14 DIAGNOSIS — G90A Postural orthostatic tachycardia syndrome (POTS): Secondary | ICD-10-CM

## 2020-04-14 DIAGNOSIS — Z7901 Long term (current) use of anticoagulants: Secondary | ICD-10-CM

## 2020-04-14 DIAGNOSIS — I498 Other specified cardiac arrhythmias: Secondary | ICD-10-CM

## 2020-04-14 DIAGNOSIS — Z86718 Personal history of other venous thrombosis and embolism: Secondary | ICD-10-CM

## 2020-04-14 DIAGNOSIS — R0602 Shortness of breath: Secondary | ICD-10-CM

## 2020-04-14 MED ORDER — ISOSORBIDE MONONITRATE ER 30 MG PO TB24: 30.0000 mg | ORAL_TABLET | Freq: Every day | ORAL | 3 refills | Status: DC

## 2020-04-14 NOTE — Progress Notes (Signed)
Cardiology Office Note:    Date:  04/14/2020   ID:  Tara Clarke, DOB 03/09/1976, MRN [id number]  PCP:  Algis Greenhouse, MD  Cardiologist:  Shirlee More, MD    Referring MD: Algis Greenhouse, MD    ASSESSMENT:    1. 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 Cardiomyopathy, unspecified type ([place])   3. POTS (postural orthostatic tachycardia syndrome)   4. History of DVT (deep vein thrombosis)   5. Chronic anticoagulation   6. On home oxygen therapy    PLAN:    In order of problems listed above:  1. Although catheter induced spasm can be nonspecific it seems to fit clinically here and also place her on oral nitrates to reduce her frequency of episodes and nitroglycerin usage.  I do not feel this puts her at increased risk for planned GYN surgery 2. Function by myocardial perfusion study was normal by echo mildly reduced and with planned surgery recheck echocardiogram and also check proBNP preoperatively 3. Stable has done well with the beta-blocker I told her that her tricyclic antidepressant can be provocative but she does not think she can stop it because of severity of migraine 4. She is on continued anticoagulant reduce to half dose by heme-onc prior to her surgery with GYN bleeding.  She had her IVC filter removed.  She will need to resume anticoagulants when safe postoperatively from a bleeding perspective 5. Stable continues with ambulatory oxygen   Next appointment: 6 months   Medication Adjustments/Labs and Tests Ordered: Current medicines are reviewed at length with the patient today.  Concerns regarding medicines are outlined above.  Orders Placed This Encounter  Procedures  . Basic Metabolic Panel (BMET)  . ECHOCARDIOGRAM COMPLETE   Meds ordered this encounter  Medications  . isosorbide mononitrate (IMDUR) 30 MG 24 hr tablet    Sig: Take 1 tablet (30 mg total) by mouth daily.    Dispense:  90 tablet    Refill:  3    Chief Complaint  Patient presents with  . Follow-up   . Chest Pain    With normal coronary angiography and catheter induced spasm  . Tachycardia    History of Present Illness:    Tara Clarke is a 44 y.o. female with a hx of chest pain abnormal myocardial perfusion study and subsequent normal coronary angiography with catheter induced spasm last seen 09/20/2019.  Other problems include anemia chronic anticoagulation with venous thromboembolism and orthostatic tachycardia.  And IVC filter inserted April 2016 when she had GI bleeding complicating anticoagulant therapy.  She is maintained on reduced dose anticoagulation Xarelto 10 mg daily by hematology and is on nasal oxygen 2 L/min with activity..   Compliance with diet, lifestyle and medications: Yes  Continues to have atypical anginal discomfort at rest perhaps a couple times a week relieved with nitroglycerin and did have catheter induced vasospasm.  After discussion with her of the benefit of reducing symptomatic episodes I will place her on Imdur 30 mg daily.  She takes a loop diuretic and will check her renal function potassium today and is on decreased dose anticoagulant awaiting GYN surgery.  Recheck her echocardiogram with concern of mildly reduced ejection fraction previously Past Medical History:  Diagnosis Date  . Abdominal wall hernia 04/03/2018  . Abnormal CT of liver 02/11/2018  . Anemia 08/08/2015  . Arthritis of both knees 02/04/2014  . Asthma    daily and prn inhalers  . Asthma 12/25/2009  . B12 deficiency 06/11/2016  2017: B12 170, MMA: 194 HB 12/89 2017: parenteral, did not increase with oral, has had bowel surgery  . Breast mass, right 1 cm 11 oclock 08/16/2011  . Bronchitis 05/11/2018  . Cancer ([place]) 12/2011   breast  . Chest wall mass 03/01/2013  . Chronic anticoagulation 08/10/2015   For recurrent DVT and PE off anticoagulation  . Chronic bilateral low back pain with left-sided sciatica 02/19/2016  . Community acquired pneumonia 01/11/2012  . DDD (degenerative disc  disease), lumbar 02/19/2016  . Depression   . Family history of breast cancer in mother 10/21/2011  . Fibroadenoma of breast, left side, biopsy proven. 08/16/2011  . Headache(784.0)    monthly with menses  . Heart palpitations   . History of bacterial pneumonia 02/11/2018  . 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 Hx pulmonary embolism 12/06/2014   PE x2 both provoked after surgery, 12/2013, 11/2014  . Infected sebaceous cyst of left elbow 04/12/2013  . Intra-abdominal abscess ([place]) 10/21/2015  . Intractable migraine with aura 12/25/2009  . Liver lesion 02/12/2018   Last Assessment & Plan:  She will be scheduled for an MRI/MRCP of the liver in the near future to better characterize this lesion.  While there are multiple benign possibilities, including hemangioma, adenoma, and focal nodular hyperplasia, it is concerning that this lesion was not seen on her prior chest CT in 2018.  Given her symptoms of worsening nausea and unintentional weight loss,the MRCP po  . Lumbar facet arthropathy 02/19/2016  . Lymph node enlargement    left  . Memory loss 06/11/2016  . Migraines 11/15/2014  . On home oxygen therapy 05/19/2015   3L continuous; initiated by Pulmonologist on 02/2015  . Pleuritic chest pain 02/10/2012  . PONV (postoperative nausea and vomiting)    nausea  . POTS (postural orthostatic tachycardia syndrome) 02/13/2014  . Recurrent major depressive disorder, in remission ([place]) 02/04/2014  . Sacroiliac joint pain 11/01/2016   Added automatically from request for surgery [phone]  . Sinus tachycardia 10/25/2018  . SOB (shortness of breath) 05/28/2016  . Wound seroma 04/30/2012    Past Surgical History:  Procedure Laterality Date  . BREAST BIOPSY  09/25/2011   Procedure: BREAST  BIOPSY;  Surgeon: Stark Klein, MD;  Location: [facility];  Service: General;  Laterality: Bilateral;  Excision Bilateral Breast Masses  . CARDIAC CATHETERIZATION     04-10-2011  . [place] and 2001  . HERNIA REPAIR  age 44 or 25   IHR  . KNEE SURGERY  2003   right patella  . LEFT HEART CATH AND CORONARY ANGIOGRAPHY N/A 08/27/2019   Procedure: LEFT HEART CATH AND CORONARY ANGIOGRAPHY;  Surgeon: Nelva Bush, MD;  Location: [place] [facility];  Service: Cardiovascular;  Laterality: N/A;  . MASS EXCISION Left 03/24/2013   Procedure: EXCISION LEFT CHEST WALL MASS;  Surgeon: Stark Klein, MD;  Location: [place];  Service: General;  Laterality: Left;  Marland Kitchen MASTECTOMY  01/01/2012   bilateral    Current Medications: Current Meds  Medication Sig  . albuterol (PROVENTIL HFA;VENTOLIN HFA) 108 (90 BASE) MCG/ACT inhaler Inhale 2 puffs into the lungs as needed.   Marland Kitchen amitriptyline (ELAVIL) 50 MG tablet Take 50 mg by mouth at bedtime.   Marland Kitchen  EPINEPHrine (EPIPEN JR) 0.15 MG/0.3ML injection Inject 0.15 mg into the muscle as needed. Uses adult dose epi pen prn  . furosemide (LASIX) 40 MG tablet Take 1 tablet (40 mg total) by mouth daily.  . metoprolol tartrate (LOPRESSOR) 25 MG tablet TAKE 1 TABLET BY MOUTH THREE TIMES DAILY( AT 6AM, NOON, AND 6PM)  . nitroGLYCERIN (NITROSTAT) 0.4 MG SL tablet Place 1 tablet (0.4 mg total) under the tongue every 5 (five) minutes as needed for chest pain.  . OXYGEN Inhale 2.5 L into the lungs continuous.   . rivaroxaban (XARELTO) 20 MG TABS tablet Take 20 mg by mouth at bedtime.   . sertraline (ZOLOFT) 100 MG tablet Take 100 mg by mouth at bedtime.      Allergies:   Aspirin, Bee venom, Shellfish-derived products, Hydrocodone, Norco [hydrocodone-acetaminophen], Oxycodone-acetaminophen, Tape, Other, and Tegaderm ag mesh [silver]   Social History   Socioeconomic History  . Marital status: Married    Spouse name: Not on file  . Number of children:  Not on file  . Years of education: Not on file  . Highest education level: Not on file  Occupational History  . Not on file  Tobacco Use  . Smoking status: Never Smoker  . Smokeless tobacco: Never Used  Vaping Use  . Vaping Use: Never used  Substance and Sexual Activity  . Alcohol use: No  . Drug use: No  . Sexual activity: Yes  Other Topics Concern  . Not on file  Social History Narrative  . Not on file   Social Determinants of Health   Financial Resource Strain:   . Difficulty of Paying Living Expenses:   Food Insecurity:   . Worried About Charity fundraiser in the Last Year:   . Arboriculturist in the Last Year:   Transportation Needs:   . Film/video editor (Medical):   Marland Kitchen Lack of Transportation (Non-Medical):   Physical Activity:   . Days of Exercise per Week:   . Minutes of Exercise per Session:   Stress:   . Feeling of Stress :   Social Connections:   . Frequency of Communication with Friends and Family:   . Frequency of Social Gatherings with Friends and Family:   . Attends Religious Services:   . Active Member of Clubs or Organizations:   . Attends Archivist Meetings:   Marland Kitchen Marital Status:      Family History: The patient's family history includes Cancer in her maternal aunt and maternal grandmother; Cancer (age of onset: 37) in her cousin; Diabetes in her mother. She was adopted. ROS:   Please see the history of present illness.    All other systems reviewed and are negative.  EKGs/Labs/Other Studies Reviewed:    The following studies were reviewed today:    Recent Labs: 08/20/2019: ALT 5; Hemoglobin 12.9; Platelets 319; TSH 0.771 09/20/2019: BUN 12; Creatinine, Ser 0.68; Potassium 4.7; Sodium 141  Recent Lipid Panel    Component Value Date/Time   CHOL 211 (H) 08/20/2019 1125   TRIG 108 08/20/2019 1125   HDL 59 08/20/2019 1125   CHOLHDL 3.6 08/20/2019 1125   LDLCALC 133 (H) 08/20/2019 1125    Physical Exam:    VS:  BP 118/70    Pulse 92   Ht 5\' 1"  (1.549 m)   Wt 246 lb (111.6 kg)   SpO2 97%   BMI 46.48 kg/m     Wt Readings from Last 3 Encounters:  04/14/20 246 lb (  111.6 kg)  09/20/19 250 lb 12.8 oz (113.8 kg)  08/27/19 245 lb (111.1 kg)     GEN:  Well nourished, well developed in no acute distress HEENT: Normal NECK: No JVD; No carotid bruits LYMPHATICS: No lymphadenopathy CARDIAC: RRR, no murmurs, rubs, gallops RESPIRATORY:  Clear to auscultation without rales, wheezing or rhonchi  ABDOMEN: Soft, non-tender, non-distended MUSCULOSKELETAL:  No edema; No deformity  SKIN: Warm and dry NEUROLOGIC:  Alert and oriented x 3 PSYCHIATRIC:  Normal affect    Signed, Shirlee More, MD  04/14/2020 1:53 PM    [facility]

## 2020-04-14 NOTE — Progress Notes (Deleted)
Cardiology Office Note:    Date:  04/14/2020   ID:  Tara Clarke, DOB June 21, 1976, MRN [id number]  PCP:  Algis Greenhouse, MD  Cardiologist:  Shirlee More, MD    Referring MD: Algis Greenhouse, MD    ASSESSMENT:    No diagnosis found. PLAN:    In order of problems listed above:  1. ***   Next appointment: ***   Medication Adjustments/Labs and Tests Ordered: Current medicines are reviewed at length with the patient today.  Concerns regarding medicines are outlined above.  No orders of the defined types were placed in this encounter.  No orders of the defined types were placed in this encounter.   No chief complaint on file.   History of Present Illness:    Tara Clarke is a 44 y.o. female with a hx of chest pain abnormal myocardial perfusion study and subsequent normal coronary angiography with catheter induced spasm last seen 09/20/2019.  Other problems include anemia chronic anticoagulation with venous thromboembolism and orthostatic tachycardia.  And IVC filter inserted April 2016 when she had GI bleeding complicating anticoagulant therapy.  She is maintained on reduced dose anticoagulation Xarelto 10 mg daily by hematology and is on nasal oxygen 2 L/min with activity.. Compliance with diet, lifestyle and medications: *** Past Medical History:  Diagnosis Date  . Abdominal wall hernia 04/03/2018  . Abnormal CT of liver 02/11/2018  . Anemia 08/08/2015  . Arthritis of both knees 02/04/2014  . Asthma    daily and prn inhalers  . Asthma 12/25/2009  . B12 deficiency 06/11/2016   2017: B12 170, MMA: 194 HB 12/89 2017: parenteral, did not increase with oral, has had bowel surgery  . Breast mass, right 1 cm 11 oclock 08/16/2011  . Bronchitis 05/11/2018  . Cancer ([place]) 12/2011   breast  . Chest wall mass 03/01/2013  . Chronic anticoagulation 08/10/2015   For recurrent DVT and PE off anticoagulation  . Chronic bilateral low back pain with left-sided sciatica 02/19/2016  .  Community acquired pneumonia 01/11/2012  . DDD (degenerative disc disease), lumbar 02/19/2016  . Depression   . Family history of breast cancer in mother 10/21/2011  . Fibroadenoma of breast, left side, biopsy proven. 08/16/2011  . Headache(784.0)    monthly with menses  . Heart palpitations   . History of bacterial pneumonia 02/11/2018  . 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 Hx pulmonary embolism 12/06/2014   PE x2 both provoked after surgery, 12/2013, 11/2014  . Infected sebaceous cyst of left elbow 04/12/2013  . Intra-abdominal abscess ([place]) 10/21/2015  . Intractable migraine with aura 12/25/2009  . Liver lesion 02/12/2018   Last Assessment & Plan:  She will be scheduled for an MRI/MRCP of the liver in the near future to better characterize this lesion.  While there are multiple benign possibilities, including hemangioma, adenoma, and focal nodular hyperplasia, it is concerning that this lesion was not seen on her prior chest CT in 2018.  Given her symptoms of worsening nausea and unintentional weight loss,the MRCP po  . Lumbar facet arthropathy 02/19/2016  . Lymph node enlargement    left  . Memory loss 06/11/2016  . Migraines 11/15/2014  . On home oxygen therapy 05/19/2015   3L continuous; initiated by  Pulmonologist on 02/2015  . Pleuritic chest pain 02/10/2012  . PONV (postoperative nausea and vomiting)    nausea  . POTS (postural orthostatic tachycardia syndrome) 02/13/2014  . Recurrent major depressive disorder, in remission ([place]) 02/04/2014  . Sacroiliac joint pain 11/01/2016   Added automatically from request for surgery [phone]  . Sinus tachycardia 10/25/2018  . SOB (shortness of breath) 05/28/2016  . Wound seroma 04/30/2012    Past Surgical History:  Procedure  Laterality Date  . BREAST BIOPSY  09/25/2011   Procedure: BREAST BIOPSY;  Surgeon: Stark Klein, MD;  Location: [facility];  Service: General;  Laterality: Bilateral;  Excision Bilateral Breast Masses  . CARDIAC CATHETERIZATION     04-10-2011  . [place] and 2001  . HERNIA REPAIR  age 76 or 63   IHR  . KNEE SURGERY  2003   right patella  . LEFT HEART CATH AND CORONARY ANGIOGRAPHY N/A 08/27/2019   Procedure: LEFT HEART CATH AND CORONARY ANGIOGRAPHY;  Surgeon: Nelva Bush, MD;  Location: [facility];  Service: Cardiovascular;  Laterality: N/A;  . MASS EXCISION Left 03/24/2013   Procedure: EXCISION LEFT CHEST WALL MASS;  Surgeon: Stark Klein, MD;  Location: [facility];  Service: General;  Laterality: Left;  Marland Kitchen MASTECTOMY  01/01/2012   bilateral    Current Medications: No outpatient medications have been marked as taking for the 04/14/20 encounter (Appointment) with Richardo Priest, MD.     Allergies:   Aspirin, Bee venom, Shellfish-derived products, Hydrocodone, Norco [hydrocodone-acetaminophen], Oxycodone-acetaminophen, Tape, Other, and Tegaderm ag mesh [silver]   Social History   Socioeconomic History  . Marital status: Married    Spouse name: Not on file  . Number of children: Not on file  . Years of education: Not on file  . Highest education level: Not on file  Occupational History  . Not on file  Tobacco Use  . Smoking status: Never Smoker  . Smokeless tobacco: Never Used  Vaping Use  . Vaping Use: Never used  Substance and Sexual Activity  . Alcohol use: No  . Drug use: No  . Sexual activity: Yes  Other Topics Concern  . Not on file  Social History Narrative  . Not on file   Social Determinants of Health   Financial Resource Strain:   . Difficulty of Paying Living Expenses:   Food Insecurity:   . Worried About Charity fundraiser in the Last Year:   . Arboriculturist in the Last Year:   Transportation Needs:   . Lexicographer (Medical):   Marland Kitchen Lack of Transportation (Non-Medical):   Physical Activity:   . Days of Exercise per Week:   . Minutes of Exercise per Session:   Stress:   . Feeling of Stress :   Social Connections:   . Frequency of Communication with Friends and Family:   . Frequency of Social Gatherings with Friends and Family:   . Attends Religious Services:   . Active Member of Clubs or Organizations:   . Attends Archivist Meetings:   Marland Kitchen Marital Status:      Family History: The patient's ***family history includes Cancer in her maternal aunt and maternal grandmother; Cancer (age of onset: 30) in her cousin; Diabetes in her mother. She was adopted. ROS:   Please see the history of present illness.    All other systems reviewed and are negative.  EKGs/Labs/Other Studies Reviewed:    The following  studies were reviewed today:  EKG:  EKG ordered today and personally reviewed.  The ekg ordered today demonstrates ***  Recent Labs: 02/04/2020 [facility]: Hemoglobin 13.5 iron elevated 372 saturation normal 27% CMP shows a potassium 4.5 creatinine 0.58 GFR greater than 90 cc and normal liver function tests 08/20/2019: ALT 5; Hemoglobin 12.9; Platelets 319; TSH 0.771 09/20/2019: BUN 12; Creatinine, Ser 0.68; Potassium 4.7; Sodium 141  Recent Lipid Panel    Component Value Date/Time   CHOL 211 (H) 08/20/2019 1125   TRIG 108 08/20/2019 1125   HDL 59 08/20/2019 1125   CHOLHDL 3.6 08/20/2019 1125   LDLCALC 133 (H) 08/20/2019 1125    Physical Exam:    VS:  There were no vitals taken for this visit.    Wt Readings from Last 3 Encounters:  09/20/19 250 lb 12.8 oz (113.8 kg)  08/27/19 245 lb (111.1 kg)  08/20/19 247 lb (112 kg)     GEN: *** Well nourished, well developed in no acute distress HEENT: Normal NECK: No JVD; No carotid bruits LYMPHATICS: No lymphadenopathy CARDIAC: ***RRR, no murmurs, rubs, gallops RESPIRATORY:  Clear to auscultation without  rales, wheezing or rhonchi  ABDOMEN: Soft, non-tender, non-distended MUSCULOSKELETAL:  No edema; No deformity  SKIN: Warm and dry NEUROLOGIC:  Alert and oriented x 3 PSYCHIATRIC:  Normal affect    Signed, Shirlee More, MD  04/14/2020 10:43 AM    [place]

## 2020-04-14 NOTE — Patient Instructions (Signed)
Medication Instructions:  Your physician has recommended you make the following change in your medication:  START: Imdur 30 mg take one tablet by mouth daily. *If you need a refill on your cardiac medications before your next appointment, please call your pharmacy*   Lab Work: Your physician recommends that you return for lab work in: TODAY BMP If you have labs (blood work) drawn today and your tests are completely normal, you will receive your results only by: Marland Kitchen MyChart Message (if you have MyChart) OR . A paper copy in the mail If you have any lab test that is abnormal or we need to change your treatment, we will call you to review the results.   Testing/Procedures: None   Follow-Up: At [facility], you and your health needs are our priority.  As part of our continuing mission to provide you with exceptional heart care, we have created designated Provider Care Teams.  These Care Teams include your primary Cardiologist (physician) and Advanced Practice Providers (APPs -  Physician Assistants and Nurse Practitioners) who all work together to provide you with the care you need, when you need it.  We recommend signing up for the patient portal called "MyChart".  Sign up information is provided on this After Visit Summary.  MyChart is used to connect with patients for Virtual Visits (Telemedicine).  Patients are able to view lab/test results, encounter notes, upcoming appointments, etc.  Non-urgent messages can be sent to your provider as well.   To learn more about what you can do with MyChart, go to [email or web address].    Your next appointment:   6 month(s)  The format for your next appointment:   In Person  Provider:   Shirlee More, MD   Other Instructions

## 2020-04-15 LAB — BASIC METABOLIC PANEL
BUN/Creatinine Ratio: 11 (ref 9–23)
BUN: 9 mg/dL (ref 6–24)
CO2: 20 mmol/L (ref 20–29)
Calcium: 9.8 mg/dL (ref 8.7–10.2)
Chloride: 102 mmol/L (ref 96–106)
Creatinine, Ser: 0.84 mg/dL (ref 0.57–1.00)
GFR calc Af Amer: 100 mL/min/{1.73_m2} (ref >59)
GFR calc non Af Amer: 87 mL/min/{1.73_m2} (ref >59)
Glucose: 87 mg/dL (ref 65–99)
Potassium: 4.9 mmol/L (ref 3.5–5.2)
Sodium: 141 mmol/L (ref 134–144)

## 2020-04-15 LAB — PRO B NATRIURETIC PEPTIDE: NT-Pro BNP: 88 pg/mL (ref 0–130)

## 2020-04-18 ENCOUNTER — Telehealth: Payer: Self-pay

## 2020-04-18 NOTE — Telephone Encounter (Signed)
Spoke with patient regarding results and recommendation.  Patient verbalizes understanding and is agreeable to plan of care. Advised patient to call back with any issues or concerns.  

## 2020-04-18 NOTE — Telephone Encounter (Signed)
Left message on patients voicemail to please return our call.   

## 2020-05-04 ENCOUNTER — Other Ambulatory Visit: Payer: Self-pay

## 2020-05-04 ENCOUNTER — Ambulatory Visit (INDEPENDENT_AMBULATORY_CARE_PROVIDER_SITE_OTHER): Payer: 59

## 2020-05-04 DIAGNOSIS — I429 Cardiomyopathy, unspecified: Secondary | ICD-10-CM

## 2020-05-04 LAB — ECHOCARDIOGRAM COMPLETE
Area-P 1/2: 5.88 cm2
S' Lateral: 3.1 cm
Single Plane A4C EF: 56.9 %

## 2020-05-04 NOTE — Progress Notes (Signed)
Complete echocardiogram performed.  JImmy Toneshia Coello RDCS, RVT 

## 2020-05-05 ENCOUNTER — Telehealth: Payer: Self-pay

## 2020-05-05 NOTE — Telephone Encounter (Signed)
Spoke with patient regarding results and recommendation.  Patient verbalizes understanding and is agreeable to plan of care. Advised patient to call back with any issues or concerns.  

## 2020-05-05 NOTE — Telephone Encounter (Signed)
Left message on patients voicemail to please return our call.   

## 2020-05-21 ENCOUNTER — Other Ambulatory Visit: Payer: Self-pay | Admitting: Cardiology

## 2020-05-22 ENCOUNTER — Other Ambulatory Visit: Payer: Self-pay

## 2020-05-22 MED ORDER — FUROSEMIDE 40 MG PO TABS: 40.0000 mg | ORAL_TABLET | Freq: Every day | ORAL | 3 refills | Status: DC

## 2020-05-22 NOTE — Progress Notes (Signed)
Refill on Furosemide sent in per faxed request

## 2020-06-01 ENCOUNTER — Encounter: Payer: Self-pay | Admitting: Genetic Counselor

## 2020-08-25 ENCOUNTER — Other Ambulatory Visit: Payer: Self-pay | Admitting: Cardiology

## 2020-08-28 MED ORDER — METOPROLOL TARTRATE 25 MG PO TABS: ORAL_TABLET | ORAL | 2 refills | Status: AC

## 2020-12-08 DIAGNOSIS — J4540 Moderate persistent asthma, uncomplicated: Secondary | ICD-10-CM | POA: Insufficient documentation

## 2020-12-08 HISTORY — DX: Moderate persistent asthma, uncomplicated: J45.40

## 2021-12-13 ENCOUNTER — Other Ambulatory Visit: Payer: Self-pay | Admitting: Cardiology

## 2022-01-13 ENCOUNTER — Other Ambulatory Visit: Payer: Self-pay | Admitting: Cardiology

## 2022-01-25 ENCOUNTER — Ambulatory Visit: Payer: 59 | Admitting: Cardiology

## 2022-02-18 ENCOUNTER — Other Ambulatory Visit: Payer: Self-pay | Admitting: Cardiology

## 2022-02-18 NOTE — Telephone Encounter (Signed)
Furosemide 40 mg tablet # 30 only with message patient needs appointment for future refills / 1st attempt  ?Sent to  [id number] - [place], [place] ?

## 2023-01-29 DIAGNOSIS — R002 Palpitations: Secondary | ICD-10-CM | POA: Insufficient documentation

## 2023-01-29 DIAGNOSIS — R599 Enlarged lymph nodes, unspecified: Secondary | ICD-10-CM | POA: Insufficient documentation

## 2023-02-05 NOTE — Progress Notes (Unsigned)
Cardiology Office Note:    Date:  02/06/2023   ID:  Tara Clarke, DOB [birth year], MRN [id number]  PCP:  Olive Bass, MD  Cardiologist:  Norman Herrlich, MD    Referring MD: Olive Bass, MD    ASSESSMENT:    1. Coronary vasospasm   2. Chronic anticoagulation   3. Presence of IVC filter   4. History of DVT (deep vein thrombosis)   5. Hx pulmonary embolism    PLAN:    In order of problems listed above:  Stable pattern continue as needed nitroglycerin No longer anticoagulated I cannot see a reason not to use semaglutide as it is particularly beneficial from a cardiovascular perspective and with her BMI of greater than 45 it is appropriate  Next appointment: 1 year   Medication Adjustments/Labs and Tests Ordered: Current medicines are reviewed at length with the patient today.  Concerns regarding medicines are outlined above.  No orders of the defined types were placed in this encounter.  No orders of the defined types were placed in this encounter.   Chief Complaint  Patient presents with   Follow-up   coronary spasm   Use semaglutide?  History of Present Illness:    Tara Clarke is a 47 y.o. female with a hx of chest pain with normal coronary arteriography and catheter induced coronary artery spasm anemia with chronic anticoagulation orthostatic hypotension she has an IVC filter and uses nasal oxygen for chronic hypoxic respiratory failure.  She was last seen 04/14/220.  Following the visit she had an echocardiogram at [facility] which showed low normal EF 50 to 55% grade 1 diastolic dysfunction normal right heart size function and pulmonary artery pressure and no significant valvular abnormality.  She had a CT of the chest performed PE protocol 12/11/2022 that was normal including cardiovascular structures  She was most recently seen with her PCP 11/14/2018 for mild intermittent asthma.  Recent labs 10/25/2022 shows a normal hemoglobin 13.9 BMP  normal creatinine 0.56 potassium 5.0 sodium 138 lipid profile with a cholesterol 223 LDL 134 triglycerides 67 HDL 68.  Compliance with diet, lifestyle and medications: Yes  She is remarkably improved following hysterectomy hemoglobin is normalized strength and endurance are better she uses oxygen rarely and a decision was made and her anticoagulant was discontinued about 6 months ago Stable pattern of anginal discomfort with or without activity relieved with nitroglycerin occurs perhaps once a month no change Having edema shortness of breath no longer uses a diuretic He is concerned about weight and tells me that I need to get an opinion about semaglutide Past Medical History:  Diagnosis Date   Abdominal wall hernia 04/03/2018   Abnormal CT of liver 02/11/2018   Anemia 08/08/2015   Arthritis of both knees 02/04/2014   Asthma    daily and prn inhalers   Asthma 12/25/2009   B12 deficiency 06/11/2016   2017: B12 170, MMA: 194 HB 12/89 2017: parenteral, did not increase with oral, has had bowel surgery   Breast mass, right 1 cm 11 oclock 08/16/2011   Bronchitis 05/11/2018   Cancer 12/2011   breast   Chest wall mass 03/01/2013   Chronic anticoagulation 08/10/2015   For recurrent DVT and PE off anticoagulation   Chronic bilateral low back pain with left-sided sciatica 02/19/2016   Community acquired pneumonia 01/11/2012   DDD (degenerative disc disease), lumbar 02/19/2016   Depression    Family history of breast cancer in mother 10/21/2011   Fibroadenoma of  breast, left side, biopsy proven. 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  While there are multiple benign possibilities, including hemangioma, adenoma, and focal nodular hyperplasia, it is concerning that this lesion was not seen on her prior chest CT in 2018.  Given her symptoms of worsening nausea and unintentional weight loss,the MRCP po   Lumbar facet arthropathy 02/19/2016   Lymph node enlargement    left   Memory loss 06/11/2016   Migraines 11/15/2014   On home oxygen therapy 05/19/2015   3L continuous; initiated by Pulmonologist on 02/2015   Pleuritic chest pain 02/10/2012   PONV (postoperative nausea and vomiting)    nausea   POTS (postural orthostatic tachycardia syndrome) 02/13/2014   Recurrent major depressive disorder, in remission 02/04/2014   Sacroiliac joint pain 11/01/2016   Added automatically from request for surgery [id number]   Sinus tachycardia 10/25/2018   SOB (shortness of breath) 05/28/2016   Wound seroma 04/30/2012    Past Surgical History:  Procedure Laterality Date   BREAST BIOPSY  09/25/2011   Procedure: BREAST BIOPSY;  Surgeon: Almond Lint, MD;  Location: [facility];  Service: General;  Laterality: Bilateral;  Excision Bilateral Breast Masses   CARDIAC CATHETERIZATION     04-10-2011   CESAREAN SECTION  1999 and 2001   HERNIA REPAIR  age 38 or 7   IHR   KNEE SURGERY  2003   right patella   LEFT HEART CATH AND CORONARY ANGIOGRAPHY N/A 08/27/2019   Procedure: LEFT HEART CATH AND CORONARY ANGIOGRAPHY;  Surgeon: Yvonne Kendall, MD;  Location: [facility];  Service: Cardiovascular;  Laterality: N/A;   MASS EXCISION Left 03/24/2013   Procedure: EXCISION LEFT CHEST WALL MASS;  Surgeon: Almond Lint, MD;   Location: [facility];  Service: General;  Laterality: Left;   MASTECTOMY  01/01/2012   bilateral    Current Medications: Current Meds  Medication Sig   albuterol (PROVENTIL HFA;VENTOLIN HFA) 108 (90 BASE) MCG/ACT inhaler Inhale 2 puffs into the lungs as needed.    EPINEPHrine (EPIPEN JR) 0.15 MG/0.3ML injection Inject 0.15 mg into the muscle as needed. Uses adult dose epi pen prn   furosemide (LASIX) 40 MG tablet Take 1 tablet (40 mg total) by mouth daily. Needs appointment for future refill   metoprolol tartrate (LOPRESSOR) 25 MG tablet TAKE 1 TABLET BY MOUTH THREE TIMES DAILY( AT 6AM, NOON, AND 6PM)   nitroGLYCERIN (NITROSTAT) 0.4 MG SL tablet Place 1 tablet (0.4 mg total) under the tongue every 5 (five) minutes as needed for chest pain.   OXYGEN Inhale 2.5 L into the lungs daily as needed (Shortness of breath).   sertraline (ZOLOFT) 100 MG tablet Take 100 mg by mouth at bedtime.  Allergies:   Aspirin, Bee venom, Shellfish-derived products, Hydrocodone, Norco [hydrocodone-acetaminophen], Oxycodone-acetaminophen, Tape, and Other   Social History   Socioeconomic History   Marital status: Married    Spouse name: Not on file   Number of children: Not on file   Years of education: Not on file   Highest education level: Not on file  Occupational History   Not on file  Tobacco Use   Smoking status: Never   Smokeless tobacco: Never  Vaping Use   Vaping Use: Never used  Substance and Sexual Activity   Alcohol use: No   Drug use: No   Sexual activity: Yes  Other Topics Concern   Not on file  Social History Narrative   Not on file   Social Determinants of Health   Financial Resource Strain: Not on file  Food Insecurity: Not on file  Transportation Needs: Not on file  Physical Activity: Not on file  Stress: Not on file  Social Connections: Not on file     Family History: The patient's family history includes Cancer in her maternal aunt and maternal  grandmother; Cancer (age of onset: 64) in her cousin; Diabetes in her mother. She was adopted. ROS:   Please see the history of present illness.    All other systems reviewed and are negative.  EKGs/Labs/Other Studies Reviewed:    The following studies were reviewed today:  Cardiac Studies & Procedures   CARDIAC CATHETERIZATION  CARDIAC CATHETERIZATION 08/27/2019  Narrative Conclusions: 1. No angiographically significant atherosclerosis coronary artery disease.  Catheter-induced vasospasm of the proximal RCA was noted; this resolved with intracoronary nitroglycerin. 2. Moderately elevated left ventricular filling pressure.  Recommendations: 1. Primary prevention of coronary artery disease. 2. Initiate furosemide 40 mg daily. 3. If no evidence of bleeding at catheterization site, restart bridging enoxaparin tomorrow morning and rivaroxaban tomorrow evening. 4. Optimization of evidence-based heart failure therapy for treatment of nonischemic cardiomyopathy per Dr. Tomie China.  Yvonne Kendall, MD [facility] Pager: [phone]  Findings Coronary Findings Diagnostic  Dominance: Right  Left Main Vessel is large. Vessel is angiographically normal.  Left Anterior Descending Vessel is large.  First Diagonal Branch Vessel is large in size.  Left Circumflex Vessel is large. Vessel is angiographically normal.  First Obtuse Marginal Branch Vessel is moderate in size.  Second Obtuse Marginal Branch Vessel is small in size.  Third Obtuse Marginal Branch Vessel is large in size.  Right Coronary Artery Vessel is large. Prox RCA lesion is 40% stenosed. The lesion is spasm. Lesion resolves with intracoronary nitroglycerin.  Right Posterior Descending Artery Vessel is moderate in size.  Right Posterior Atrioventricular Artery Vessel is small in size.  Intervention  No interventions have been documented.     ECHOCARDIOGRAM  ECHOCARDIOGRAM COMPLETE  05/04/2020  Narrative ECHOCARDIOGRAM REPORT    Patient Name:   KILEEN LANGE Date of Exam: 05/04/2020 Medical Rec #:  [id number]        Height:       61.0 in Accession #:    [id number]       Weight:       246.0 lb Date of Birth:  09-Oct-1976        BSA:          2.062 m Patient Age:    43 years         BP:           118/70 mmHg Patient Gender: F  HR:           92 bpm. Exam Location:  [place]  Procedure: 2D Echo  Indications:    Cardiomyopathy, unspecified type (HCC) [I42.9 (ICD-10-CM)]  History:        Patient has prior history of Echocardiogram examinations, most recent 07/28/2019. Abnormal stress test, History of DVT; Risk Factors:POTS.  Sonographer:    Louie Boston Referring Phys: [phone] Jaquari Reckner J Christasia Angeletti  IMPRESSIONS   1. Left ventricular ejection fraction, by estimation, is 50 to 55%. The left ventricle has low normal function. The left ventricle has no regional wall motion abnormalities. Left ventricular diastolic parameters are consistent with Grade I diastolic dysfunction (impaired relaxation). 2. Right ventricular systolic function is normal. The right ventricular size is normal. There is normal pulmonary artery systolic pressure. 3. The mitral valve is normal in structure. Trivial mitral valve regurgitation. No evidence of mitral stenosis. 4. The aortic valve is normal in structure. Aortic valve regurgitation is not visualized. No aortic stenosis is present. 5. The inferior vena cava is normal in size with greater than 50% respiratory variability, suggesting right atrial pressure of 3 mmHg.  FINDINGS Left Ventricle: Left ventricular ejection fraction, by estimation, is 50 to 55%. The left ventricle has low normal function. The left ventricle has no regional wall motion abnormalities. The left ventricular internal cavity size was normal in size. There is no left ventricular hypertrophy. Left ventricular diastolic parameters are consistent with Grade I diastolic  dysfunction (impaired relaxation).  Right Ventricle: The right ventricular size is normal. No increase in right ventricular wall thickness. Right ventricular systolic function is normal. There is normal pulmonary artery systolic pressure. The tricuspid regurgitant velocity is 2.08 m/s, and with an assumed right atrial pressure of 3 mmHg, the estimated right ventricular systolic pressure is 20.3 mmHg.  Left Atrium: Left atrial size was normal in size.  Right Atrium: Right atrial size was normal in size.  Pericardium: There is no evidence of pericardial effusion.  Mitral Valve: The mitral valve is normal in structure. Normal mobility of the mitral valve leaflets. Trivial mitral valve regurgitation. No evidence of mitral valve stenosis.  Tricuspid Valve: The tricuspid valve is normal in structure. Tricuspid valve regurgitation is not demonstrated. No evidence of tricuspid stenosis.  Aortic Valve: The aortic valve is normal in structure. Aortic valve regurgitation is not visualized. No aortic stenosis is present.  Pulmonic Valve: The pulmonic valve was normal in structure. Pulmonic valve regurgitation is not visualized. No evidence of pulmonic stenosis.  Aorta: The aortic root is normal in size and structure.  Venous: The inferior vena cava is normal in size with greater than 50% respiratory variability, suggesting right atrial pressure of 3 mmHg.  IAS/Shunts: No atrial level shunt detected by color flow Doppler.   LEFT VENTRICLE PLAX 2D LVIDd:         4.60 cm     Diastology LVIDs:         3.10 cm     LV e' lateral:   6.96 cm/s LV PW:         1.20 cm     LV E/e' lateral: 9.3 LV IVS:        1.20 cm     LV e' medial:    5.55 cm/s LVOT diam:     2.00 cm     LV E/e' medial:  11.6 LV SV:         58 LV SV Index:   28 LVOT Area:  3.14 cm  LV Volumes (MOD) LV vol d, MOD A4C: 66.2 ml LV vol s, MOD A4C: 28.5 ml LV SV MOD A4C:     66.2 ml  RIGHT VENTRICLE             IVC RV S prime:      12.50 cm/s  IVC diam: 1.50 cm TAPSE (M-mode): 2.5 cm  LEFT ATRIUM             Index       RIGHT ATRIUM           Index LA diam:        3.90 cm 1.89 cm/m  RA Area:     13.30 cm LA Vol (A2C):   47.3 ml 22.93 ml/m RA Volume:   36.10 ml  17.50 ml/m LA Vol (A4C):   38.8 ml 18.81 ml/m LA Biplane Vol: 44.5 ml 21.58 ml/m AORTIC VALVE LVOT Vmax:   89.40 cm/s LVOT Vmean:  69.500 cm/s LVOT VTI:    0.185 m  AORTA Ao Root diam: 3.20 cm Ao Asc diam:  2.90 cm  MITRAL VALVE               TRICUSPID VALVE MV Area (PHT): 5.88 cm    TR Peak grad:   17.3 mmHg MV Decel Time: 129 msec    TR Vmax:        208.00 cm/s MV E velocity: 64.50 cm/s MV A velocity: 73.70 cm/s  SHUNTS MV E/A ratio:  0.88        Systemic VTI:  0.18 m Systemic Diam: 2.00 cm  Gypsy Balsam MD Electronically signed by Gypsy Balsam MD Signature Date/Time: 05/04/2020/9:20:34 PM    Final    MONITORS  LONG TERM MONITOR (3-14 DAYS) 01/27/2019  Narrative Johny Blamer, DOB 03-29-1976, MRN [id number]  EVENT MONITOR REPORT:   Patient was monitored from 01/15/2019 to 01/18/2019. Indication:                    Palpitations Ordering physician:  Garwin Brothers, MD Referring physician:  Garwin Brothers, MD   Baseline rhythm: Sinus  Minimum heart rate: 66 BPM.  Average heart rate: 94 BPM.  Maximal heart rate 147 BPM at 5:30 PM on 01/15/2019  Atrial arrhythmia: None significant  Ventricular arrhythmia: Rare PVCs  Conduction abnormality: None significant  Symptoms: None significant   Conclusion: Unremarkable event monitoring.  Monitoring.:  01/14/2021 to 01/18/2019  Interpreting  cardiologist: Garwin Brothers, MD Date: 01/27/2019 9:35 AM           EKG:  EKG ordered today and personally reviewed.  The ekg ordered today demonstrates sinus rhythm pattern of old pulmonary embolism consider RVH similar to 2020  Recent Labs: No results found for requested labs within last 365 days.  Recent Lipid Panel     Component Value Date/Time   CHOL 211 (H) 08/20/2019 1125   TRIG 108 08/20/2019 1125   HDL 59 08/20/2019 1125   CHOLHDL 3.6 08/20/2019 1125   LDLCALC 133 (H) 08/20/2019 1125    Physical Exam:    VS:  BP 108/74 (BP Location: Right Arm, Patient Position: Sitting)   Pulse 83   Ht  (1.549 m)   Wt 240 lb (108.9 kg)   SpO2 97%   BMI 45.35 kg/m     Wt Readings from Last 3 Encounters:  02/06/23 240 lb (108.9 kg)  04/14/20 246 lb (111.6 kg)  09/20/19 250 lb 12.8 oz (113.8 kg)  GEN:  Well nourished, well developed in no acute distress HEENT: Normal NECK: No JVD; No carotid bruits LYMPHATICS: No lymphadenopathy CARDIAC: RRR, no murmurs, rubs, gallops RESPIRATORY:  Clear to auscultation without rales, wheezing or rhonchi  ABDOMEN: Soft, non-tender, non-distended MUSCULOSKELETAL:  No edema; No deformity  SKIN: Warm and dry NEUROLOGIC:  Alert and oriented x 3 PSYCHIATRIC:  Normal affect    Signed, Norman Herrlich, MD  02/06/2023 8:30 AM    [facility]

## 2023-02-06 ENCOUNTER — Encounter: Payer: Self-pay | Admitting: Cardiology

## 2023-02-06 ENCOUNTER — Ambulatory Visit: Payer: 59 | Attending: Cardiology | Admitting: Cardiology

## 2023-02-06 VITALS — BP 108/74 | HR 83 | Ht 61.0 in | Wt 240.0 lb

## 2023-02-06 DIAGNOSIS — Z7901 Long term (current) use of anticoagulants: Secondary | ICD-10-CM

## 2023-02-06 DIAGNOSIS — Z86718 Personal history of other venous thrombosis and embolism: Secondary | ICD-10-CM | POA: Diagnosis not present

## 2023-02-06 DIAGNOSIS — Z86711 Personal history of pulmonary embolism: Secondary | ICD-10-CM

## 2023-02-06 DIAGNOSIS — I201 Angina pectoris with documented spasm: Secondary | ICD-10-CM

## 2023-02-06 DIAGNOSIS — Z95828 Presence of other vascular implants and grafts: Secondary | ICD-10-CM | POA: Diagnosis not present

## 2023-02-06 NOTE — Patient Instructions (Signed)
Medication Instructions:  Your physician recommends that you continue on your current medications as directed. Please refer to the Current Medication list given to you today.  *If you need a refill on your cardiac medications before your next appointment, please call your pharmacy*   Lab Work: None If you have labs (blood work) drawn today and your tests are completely normal, you will receive your results only by: MyChart Message (if you have MyChart) OR A paper copy in the mail If you have any lab test that is abnormal or we need to change your treatment, we will call you to review the results.   Testing/Procedures: None   Follow-Up: At [facility], you and your health needs are our priority.  As part of our continuing mission to provide you with exceptional heart care, we have created designated Provider Care Teams.  These Care Teams include your primary Cardiologist (physician) and Advanced Practice Providers (APPs -  Physician Assistants and Nurse Practitioners) who all work together to provide you with the care you need, when you need it.  We recommend signing up for the patient portal called "MyChart".  Sign up information is provided on this After Visit Summary.  MyChart is used to connect with patients for Virtual Visits (Telemedicine).  Patients are able to view lab/test results, encounter notes, upcoming appointments, etc.  Non-urgent messages can be sent to your provider as well.   To learn more about what you can do with MyChart, go to [email or web address].    Your next appointment:   1 year(s)  Provider:   Brian Munley, MD    Other Instructions None  

## 2024-09-07 ENCOUNTER — Other Ambulatory Visit: Payer: Self-pay

## 2024-09-07 NOTE — Progress Notes (Unsigned)
 Cardiology Office Note:    Date:  09/08/2024   ID:  ARRETTA TOENJES, DOB 07/22/1976, MRN [id number]  PCP:  Ofilia Lamar CROME, MD  Cardiologist:  Redell Leiter, MD    Referring MD: Ofilia Lamar CROME, MD    ASSESSMENT:    1. Coronary vasospasm   2. Chronic anticoagulation   3. Presence of IVC filter   4. Orthostatic hypotension   5. Chronic respiratory failure with hypoxia (HCC)    PLAN:    In order of problems listed above:  Stable artery spasm and angiography renew her prescription for nitroglycerin  No longer anticoagulated has an IVC filter Asymptomatic not having symptomatic orthostatic hypotension since her anemia was resolved Pittore failure is improved I will renew her prescription for furosemide  with peripheral edema she does not have heart failure   Next appointment: 1 year   Medication Adjustments/Labs and Tests Ordered: Current medicines are reviewed at length with the patient today.  Concerns regarding medicines are outlined above.  Orders Placed This Encounter  Procedures   EKG 12-Lead   No orders of the defined types were placed in this encounter.    History of Present Illness:    VYLETTE STRUBEL is a 48 y.o. female with a hx of anginal chest pain with normal coronary angiography and catheter induced coronary artery spasm anemia chronic anticoagulation for venous thromboembolism with an IVC filter orthostatic hypotension and chronic hypoxic respiratory failure requiring nasal oxygen last seen 02/06/2023.  She was seen at [facility] in March and 06/14/2024 with abdominal complaints she is afebrile sinus tachycardia rate of 127 blood pressure 132/73 in the emergency room the heart rate moderated to 102 bpm she had a CT of the abdomen pelvis without acute abnormality she was given IV fluids and discharged from the hospital without patient care CBC showed normal white count hemoglobin 14.6 stool evaluation showed no evidence her EKG showed sinus tachycardia  right axis deviation nonspecific ST abnormality.  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 including 1 just diagnosed with autism She is returning to school to get her graduate degree in education She had been out of furosemide  noticed that she is having more peripheral edema although her shortness of breath is improved no longer uses ambulatory oxygen She also has had some fleeting episodes of chest discomfort but has not needed nitroglycerin  request a new prescription she has coronary artery spasm Past Medical History:  Diagnosis Date   Abdominal wall hernia 04/03/2018   Abnormal CT of liver 02/11/2018   Anemia 08/08/2015   Arthritis of both knees 02/04/2014   Asthma 12/25/2009   B12 deficiency 06/11/2016   2017: B12 170, MMA: 194 HB 12/89 2017: parenteral, did not increase with oral, has had bowel surgery   Breast mass, right 1 cm 11 oclock 08/16/2011   Bronchitis 05/11/2018   Cancer (HCC) 12/2011   breast   Cardiomyopathy (HCC) 08/27/2019   Chest wall mass 03/01/2013   Chronic anticoagulation 08/10/2015   For recurrent DVT and PE off anticoagulation   Chronic bilateral low back pain with left-sided sciatica 02/19/2016   Community acquired pneumonia 01/11/2012   Coronary vasospasm 09/20/2019   DDD (degenerative disc disease), lumbar 02/19/2016   Depression    Family history of breast cancer in mother 10/21/2011   Fibroadenoma of breast, left side, biopsy proven. 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  While there are multiple benign possibilities, including hemangioma, adenoma, and focal nodular hyperplasia, it is concerning that this lesion was not seen on her prior chest CT in 2018.  Given her symptoms of worsening nausea and unintentional weight loss,the MRCP po   Lumbar facet arthropathy 02/19/2016   Lymph node enlargement    left   Memory loss 06/11/2016   Migraines 11/15/2014   Moderate persistent asthma without complication 12/08/2020   On home oxygen therapy 05/19/2015   3L continuous; initiated by Pulmonologist on 02/2015   Pleuritic chest pain 02/10/2012   PONV (postoperative nausea and vomiting)    nausea   POTS (postural orthostatic tachycardia syndrome) 02/13/2014   Recurrent major depressive disorder, in remission 02/04/2014   Sacroiliac joint pain 11/01/2016   Added automatically from request for surgery [id number]   Sinus tachycardia 10/25/2018   SOB (shortness of breath) 05/28/2016   Wound seroma 04/30/2012    Current Medications: Current Meds  Medication Sig   albuterol  (PROVENTIL  HFA;VENTOLIN  HFA) 108 (90 BASE) MCG/ACT inhaler Inhale 2 puffs into the lungs as needed.    EPINEPHrine  0.3 mg/0.3 mL IJ SOAJ injection Inject 0.3 mg into the muscle as needed.   furosemide  (LASIX ) 40 MG tablet Take 1 tablet (40 mg total) by mouth daily. Needs appointment for future refill   metoprolol  tartrate (LOPRESSOR ) 25 MG tablet TAKE 1 TABLET BY MOUTH THREE TIMES DAILY( AT 6AM, NOON, AND 6PM)   nitroGLYCERIN  (NITROSTAT ) 0.4 MG SL tablet Place 0.4 mg under the tongue every 5 (five) minutes as needed.   OXYGEN Inhale 2.5 L into the lungs daily as needed (Shortness of  breath).   sertraline  (ZOLOFT ) 100 MG tablet Take 100 mg by mouth at bedtime.       EKGs/Labs/Other Studies Reviewed:    The following studies were reviewed today:  Cardiac Studies & Procedures   ______________________________________________________________________________________________ CARDIAC CATHETERIZATION  CARDIAC CATHETERIZATION 08/27/2019  Conclusion Conclusions: 1. No angiographically significant atherosclerosis coronary artery disease.  Catheter-induced vasospasm of the proximal RCA was noted; this resolved with intracoronary nitroglycerin . 2. Moderately elevated left ventricular filling pressure.  Recommendations: 1. Primary prevention of coronary artery disease. 2. Initiate furosemide  40 mg daily. 3. If no evidence of bleeding at catheterization site, restart bridging enoxaparin  tomorrow morning and rivaroxaban tomorrow evening. 4. Optimization of evidence-based heart failure therapy for treatment of nonischemic cardiomyopathy per Dr. Revankar.  Lonni Hanson, MD [facility] Pager: [phone]  Findings Coronary Findings Diagnostic  Dominance: Right  Left Main Vessel is large. Vessel is angiographically normal.  Left Anterior Descending Vessel is large.  First Diagonal Branch Vessel is large in size.  Left Circumflex Vessel is large. Vessel is angiographically normal.  First Obtuse Marginal Branch Vessel is moderate in size.  Second Obtuse Marginal Branch Vessel is small in size.  Third Obtuse Marginal Branch Vessel is large in size.  Right Coronary Artery Vessel is large. Prox RCA lesion is 40% stenosed. The lesion is spasm. Lesion resolves with intracoronary nitroglycerin .  Right Posterior Descending Artery Vessel is moderate in size.  Right Posterior Atrioventricular Artery Vessel is small in size.  Intervention  No interventions have been documented.     ECHOCARDIOGRAM  ECHOCARDIOGRAM COMPLETE  05/04/2020  Narrative ECHOCARDIOGRAM REPORT    Patient Name:   CORENE RESNICK Date of Exam: 05/04/2020 Medical Rec #:  [id number]        Height:       61.0 in Accession #:    [id number]       Weight:       246.0 lb Date of Birth:  Jul 10, 1976        BSA:          2.062 m Patient Age:    43 years         BP:           118/70 mmHg Patient Gender: F                HR:           92 bpm. Exam Location:  [facility]  Procedure: 2D Echo  Indications:    Cardiomyopathy, unspecified type (HCC) [I42.9 (ICD-10-CM)]  History:        Patient has prior history of Echocardiogram examinations, most recent 07/28/2019. Abnormal stress test, History of DVT; Risk Factors:POTS.  Sonographer:    Lynwood Silvas Referring Phys: [phone] Thekla Colborn J Evelean Bigler  IMPRESSIONS   1. Left ventricular ejection fraction, by estimation, is 50 to 55%. The left ventricle has low normal function. The left ventricle has no regional wall motion abnormalities. Left ventricular diastolic parameters are consistent with Grade I diastolic dysfunction (impaired relaxation). 2. Right ventricular systolic function is normal. The right ventricular size is normal. There is normal pulmonary artery systolic pressure. 3. The mitral valve is normal in structure. Trivial mitral valve regurgitation. No evidence of mitral stenosis. 4. The aortic valve is normal in structure. Aortic valve regurgitation is not visualized. No aortic stenosis is present. 5. The inferior vena cava is normal in size with greater than 50% respiratory variability, suggesting right atrial pressure of 3 mmHg.  FINDINGS Left Ventricle: Left ventricular ejection fraction, by estimation, is 50 to 55%. The left ventricle has low normal function. The left ventricle has no regional wall motion abnormalities. The left ventricular internal cavity size was normal in size. There is no left ventricular hypertrophy. Left ventricular diastolic parameters are consistent with Grade I diastolic  dysfunction (impaired relaxation).  Right Ventricle: The right ventricular size is normal. No increase in right ventricular wall thickness. Right ventricular systolic function is normal. There is normal pulmonary artery systolic pressure. The tricuspid regurgitant velocity is 2.08 m/s, and with an assumed right atrial pressure of 3 mmHg, the estimated right ventricular systolic pressure is 20.3 mmHg.  Left Atrium: Left atrial size was normal in size.  Right Atrium: Right atrial size was normal in size.  Pericardium: There is no evidence of pericardial effusion.  Mitral Valve: The mitral valve is normal in structure. Normal mobility of the mitral valve leaflets. Trivial mitral valve regurgitation. No evidence of mitral valve stenosis.  Tricuspid Valve: The tricuspid valve is normal in structure. Tricuspid valve regurgitation is not demonstrated. No evidence of tricuspid stenosis.  Aortic Valve: The aortic valve is normal in structure. Aortic valve regurgitation is not  visualized. No aortic stenosis is present.  Pulmonic Valve: The pulmonic valve was normal in structure. Pulmonic valve regurgitation is not visualized. No evidence of pulmonic stenosis.  Aorta: The aortic root is normal in size and structure.  Venous: The inferior vena cava is normal in size with greater than 50% respiratory variability, suggesting right atrial pressure of 3 mmHg.  IAS/Shunts: No atrial level shunt detected by color flow Doppler.   LEFT VENTRICLE PLAX 2D LVIDd:         4.60 cm     Diastology LVIDs:         3.10 cm     LV e' lateral:   6.96 cm/s LV PW:         1.20 cm     LV E/e' lateral: 9.3 LV IVS:        1.20 cm     LV e' medial:    5.55 cm/s LVOT diam:     2.00 cm     LV E/e' medial:  11.6 LV SV:         58 LV SV Index:   28 LVOT Area:     3.14 cm  LV Volumes (MOD) LV vol d, MOD A4C: 66.2 ml LV vol s, MOD A4C: 28.5 ml LV SV MOD A4C:     66.2 ml  RIGHT VENTRICLE             IVC RV S prime:      12.50 cm/s  IVC diam: 1.50 cm TAPSE (M-mode): 2.5 cm  LEFT ATRIUM             Index       RIGHT ATRIUM           Index LA diam:        3.90 cm 1.89 cm/m  RA Area:     13.30 cm LA Vol (A2C):   47.3 ml 22.93 ml/m RA Volume:   36.10 ml  17.50 ml/m LA Vol (A4C):   38.8 ml 18.81 ml/m LA Biplane Vol: 44.5 ml 21.58 ml/m AORTIC VALVE LVOT Vmax:   89.40 cm/s LVOT Vmean:  69.500 cm/s LVOT VTI:    0.185 m  AORTA Ao Root diam: 3.20 cm Ao Asc diam:  2.90 cm  MITRAL VALVE               TRICUSPID VALVE MV Area (PHT): 5.88 cm    TR Peak grad:   17.3 mmHg MV Decel Time: 129 msec    TR Vmax:        208.00 cm/s MV E velocity: 64.50 cm/s MV A velocity: 73.70 cm/s  SHUNTS MV E/A ratio:  0.88        Systemic VTI:  0.18 m Systemic Diam: 2.00 cm  Lamar Fitch MD Electronically signed by Lamar Fitch MD Signature Date/Time: 05/04/2020/9:20:34 PM    Final    MONITORS  LONG TERM MONITOR (3-14 DAYS) 01/21/2019  Narrative Camelia JONETTA Lunger, DOB 1976-02-09, MRN [id number]  EVENT MONITOR REPORT:   Patient was monitored from 01/15/2019 to 01/18/2019. Indication:                    Palpitations Ordering physician:  Jennifer JONELLE Crape, MD Referring physician:  Jennifer JONELLE Crape, MD   Baseline rhythm: Sinus  Minimum heart rate: 66 BPM.  Average heart rate: 94 BPM.  Maximal heart rate 147 BPM at 5:30 PM on 01/15/2019  Atrial arrhythmia: None significant  Ventricular arrhythmia: Rare PVCs  Conduction  abnormality: None significant  Symptoms: None significant   Conclusion: Unremarkable event monitoring.  Monitoring.:  01/14/2021 to 01/18/2019  Interpreting  cardiologist: Jennifer JONELLE Crape, MD Date: 01/27/2019 9:35 AM       ______________________________________________________________________________________________      EKG Interpretation Date/Time:  Wednesday September 08 2024 14:25:07 EST Ventricular Rate:  85 PR Interval:  138 QRS Duration:  76 QT Interval:  380 QTC  Calculation: 452 R Axis:   58  Text Interpretation: Normal sinus rhythm When compared with ECG of 27-Aug-2019 08:47, No significant change since last tracing Confirmed by Monetta Rogue ([id number]) on 09/08/2024 2:35:47 PM   Recent Labs: No results found for requested labs within last 365 days.  Recent Lipid Panel    Component Value Date/Time   CHOL 211 (H) 08/20/2019 1125   TRIG 108 08/20/2019 1125   HDL 59 08/20/2019 1125   CHOLHDL 3.6 08/20/2019 1125   LDLCALC 133 (H) 08/20/2019 1125    Physical Exam:    VS:  BP 110/68   Pulse 85   Ht 5' 1 (1.549 m)   Wt 209 lb 3.2 oz (94.9 kg)   SpO2 97%   BMI 39.53 kg/m     Wt Readings from Last 3 Encounters:  09/08/24 209 lb 3.2 oz (94.9 kg)  02/06/23 240 lb (108.9 kg)  04/14/20 246 lb (111.6 kg)     GEN:  Well nourished, well developed in no acute distress HEENT: Normal NECK: No JVD; No carotid bruits LYMPHATICS: No lymphadenopathy CARDIAC: RRR, no murmurs, rubs, gallops RESPIRATORY:  Clear to auscultation without rales, wheezing or rhonchi  ABDOMEN: Soft, non-tender, non-distended MUSCULOSKELETAL:  No edema; No deformity  SKIN: Warm and dry NEUROLOGIC:  Alert and oriented x 3 PSYCHIATRIC:  Normal affect    Signed, Rogue Monetta, MD  09/08/2024 2:36 PM    [facility]

## 2024-09-08 ENCOUNTER — Ambulatory Visit: Attending: Cardiology | Admitting: Cardiology

## 2024-09-08 ENCOUNTER — Encounter: Payer: Self-pay | Admitting: Cardiology

## 2024-09-08 VITALS — BP 110/68 | HR 85 | Ht 61.0 in | Wt 209.2 lb

## 2024-09-08 DIAGNOSIS — Z95828 Presence of other vascular implants and grafts: Secondary | ICD-10-CM | POA: Diagnosis not present

## 2024-09-08 DIAGNOSIS — J9611 Chronic respiratory failure with hypoxia: Secondary | ICD-10-CM

## 2024-09-08 DIAGNOSIS — Z7901 Long term (current) use of anticoagulants: Secondary | ICD-10-CM

## 2024-09-08 DIAGNOSIS — I951 Orthostatic hypotension: Secondary | ICD-10-CM | POA: Diagnosis not present

## 2024-09-08 DIAGNOSIS — I201 Angina pectoris with documented spasm: Secondary | ICD-10-CM | POA: Diagnosis not present

## 2024-09-08 MED ORDER — NITROGLYCERIN 0.4 MG SL SUBL: 0.4000 mg | SUBLINGUAL_TABLET | SUBLINGUAL | 3 refills | Status: AC | PRN

## 2024-09-08 MED ORDER — FUROSEMIDE 40 MG PO TABS: 40.0000 mg | ORAL_TABLET | Freq: Every day | ORAL | 0 refills | Status: DC

## 2024-09-08 NOTE — Patient Instructions (Signed)
 Medication Instructions:  Your physician recommends that you continue on your current medications as directed. Please refer to the Current Medication list given to you today.  *If you need a refill on your cardiac medications before your next appointment, please call your pharmacy*  Lab Work: None If you have labs (blood work) drawn today and your tests are completely normal, you will receive your results only by: MyChart Message (if you have MyChart) OR A paper copy in the mail If you have any lab test that is abnormal or we need to change your treatment, we will call you to review the results.  Testing/Procedures: Your physician has requested that you have an echocardiogram. Echocardiography is a painless test that uses sound waves to create images of your heart. It provides your doctor with information about the size and shape of your heart and how well your heart's chambers and valves are working. This procedure takes approximately one hour. There are no restrictions for this procedure. Please do NOT wear cologne, perfume, aftershave, or lotions (deodorant is allowed). Please arrive 15 minutes prior to your appointment time.  Please note: We ask at that you not bring children with you during ultrasound (echo/ vascular) testing. Due to room size and safety concerns, children are not allowed in the ultrasound rooms during exams. Our front office staff cannot provide observation of children in our lobby area while testing is being conducted. An adult accompanying a patient to their appointment will only be allowed in the ultrasound room at the discretion of the ultrasound technician under special circumstances. We apologize for any inconvenience.   Follow-Up: At [facility], you and your health needs are our priority.  As part of our continuing mission to provide you with exceptional heart care, our providers are all part of one team.  This team includes your primary Cardiologist  (physician) and Advanced Practice Providers or APPs (Physician Assistants and Nurse Practitioners) who all work together to provide you with the care you need, when you need it.  Your next appointment:   1 year(s)  Provider:   Redell Leiter, MD    We recommend signing up for the patient portal called MyChart.  Sign up information is provided on this After Visit Summary.  MyChart is used to connect with patients for Virtual Visits (Telemedicine).  Patients are able to view lab/test results, encounter notes, upcoming appointments, etc.  Non-urgent messages can be sent to your provider as well.   To learn more about what you can do with MyChart, go to [email or web address].   Other Instructions None

## 2024-09-08 NOTE — Addendum Note (Signed)
 Addended by: SHERRE ADE I on: 09/08/2024 03:02 PM   Modules accepted: Orders

## 2024-10-06 ENCOUNTER — Ambulatory Visit: Attending: Cardiology

## 2024-10-06 DIAGNOSIS — Z7901 Long term (current) use of anticoagulants: Secondary | ICD-10-CM | POA: Diagnosis not present

## 2024-10-06 DIAGNOSIS — I201 Angina pectoris with documented spasm: Secondary | ICD-10-CM

## 2024-10-06 DIAGNOSIS — I951 Orthostatic hypotension: Secondary | ICD-10-CM

## 2024-10-06 DIAGNOSIS — J9611 Chronic respiratory failure with hypoxia: Secondary | ICD-10-CM | POA: Diagnosis not present

## 2024-10-06 DIAGNOSIS — Z95828 Presence of other vascular implants and grafts: Secondary | ICD-10-CM

## 2024-10-06 LAB — ECHOCARDIOGRAM COMPLETE
Area-P 1/2: 4.08 cm2
P 1/2 time: 332 ms
S' Lateral: 3.2 cm

## 2024-10-07 ENCOUNTER — Other Ambulatory Visit: Payer: Self-pay | Admitting: Cardiology

## 2024-10-07 DIAGNOSIS — I201 Angina pectoris with documented spasm: Secondary | ICD-10-CM

## 2024-10-07 DIAGNOSIS — J9611 Chronic respiratory failure with hypoxia: Secondary | ICD-10-CM

## 2024-10-08 ENCOUNTER — Encounter: Payer: Self-pay | Admitting: Cardiology
